# Patient Record
Sex: Male | Born: 1965 | Race: White | Hispanic: No | Marital: Married | Smoking: Never smoker
Health system: Southern US, Community
[De-identification: ages and names within clinical notes are randomized; demographics above are authoritative.]

## PROBLEM LIST (undated history)

## (undated) DIAGNOSIS — R51 Headache: Secondary | ICD-10-CM

## (undated) DIAGNOSIS — E785 Hyperlipidemia, unspecified: Secondary | ICD-10-CM

## (undated) HISTORY — DX: Hyperlipidemia, unspecified: E78.5

## (undated) HISTORY — PX: APPENDECTOMY (OPEN): SHX54

## (undated) HISTORY — DX: Headache: R51

## (undated) HISTORY — PX: WISDOM TOOTH EXTRACTION: SHX21

---

## 2003-09-13 ENCOUNTER — Emergency Department (HOSPITAL_COMMUNITY): Admission: EM | Admit: 2003-09-13 | Discharge: 2003-09-14 | Payer: Self-pay | Admitting: Emergency Medicine

## 2003-09-14 ENCOUNTER — Encounter: Admission: RE | Admit: 2003-09-14 | Discharge: 2003-09-14 | Payer: Self-pay | Admitting: Internal Medicine

## 2007-05-03 ENCOUNTER — Emergency Department (HOSPITAL_COMMUNITY): Admission: EM | Admit: 2007-05-03 | Discharge: 2007-05-03 | Payer: Self-pay | Admitting: Family Medicine

## 2008-04-08 ENCOUNTER — Emergency Department (HOSPITAL_COMMUNITY): Admission: EM | Admit: 2008-04-08 | Discharge: 2008-04-08 | Payer: Self-pay | Admitting: Emergency Medicine

## 2009-12-10 IMAGING — CR DG LUMBAR SPINE COMPLETE 4+V
5 series · 5 of 5 positions shown · non-contrast
Comparison: None peri

CLINICAL DATA: Low back pain following an injury.

LUMBAR SPINE - COMPLETE 4+ VIEW

[t l-spine a.p.]
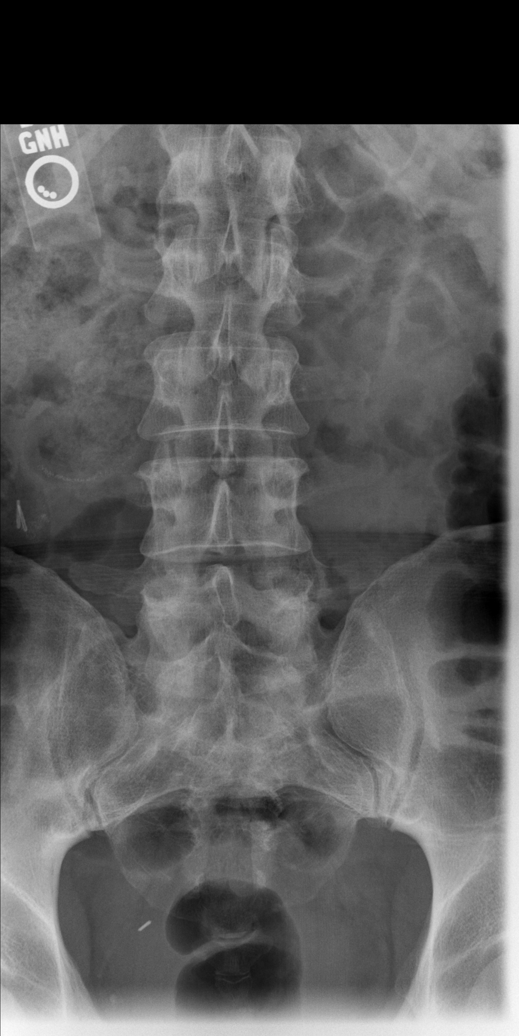

[t l-spine oblique exposure (1 of 2)]
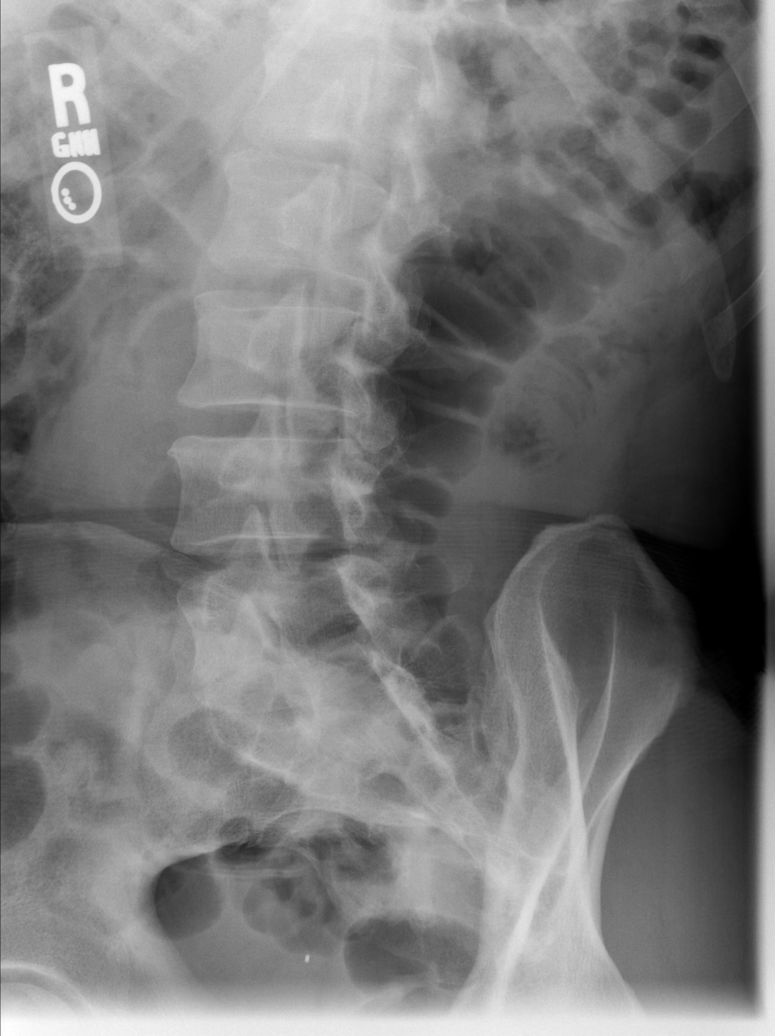

[t l-spine oblique exposure (2 of 2)]
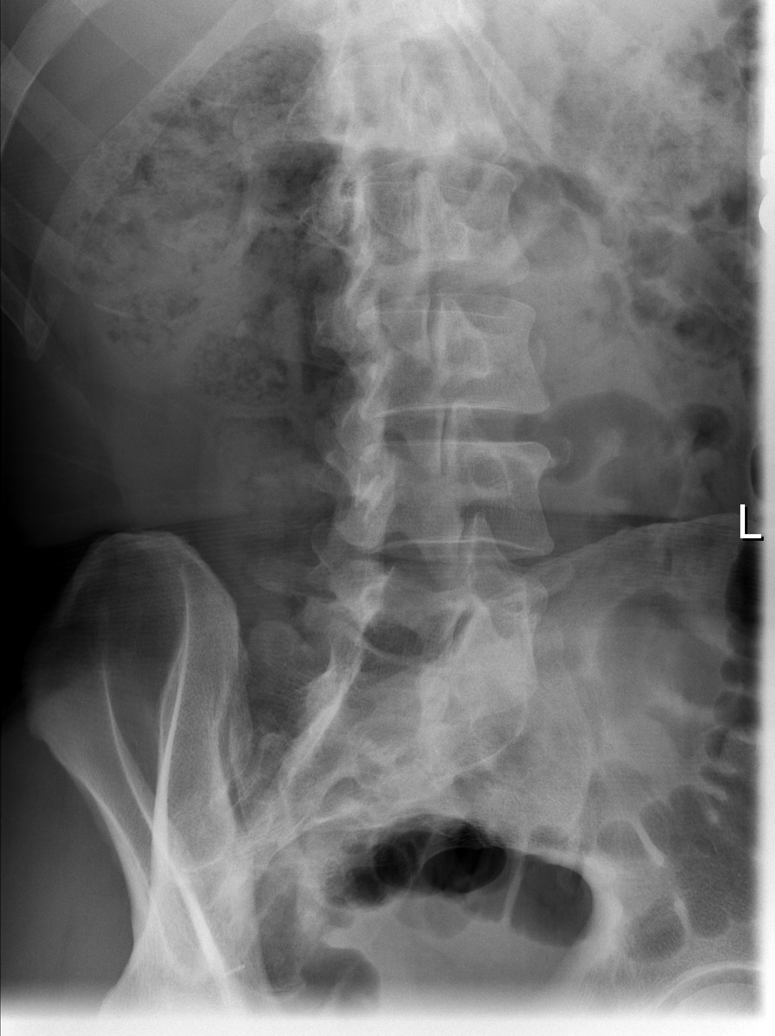

[t l-spine lat]
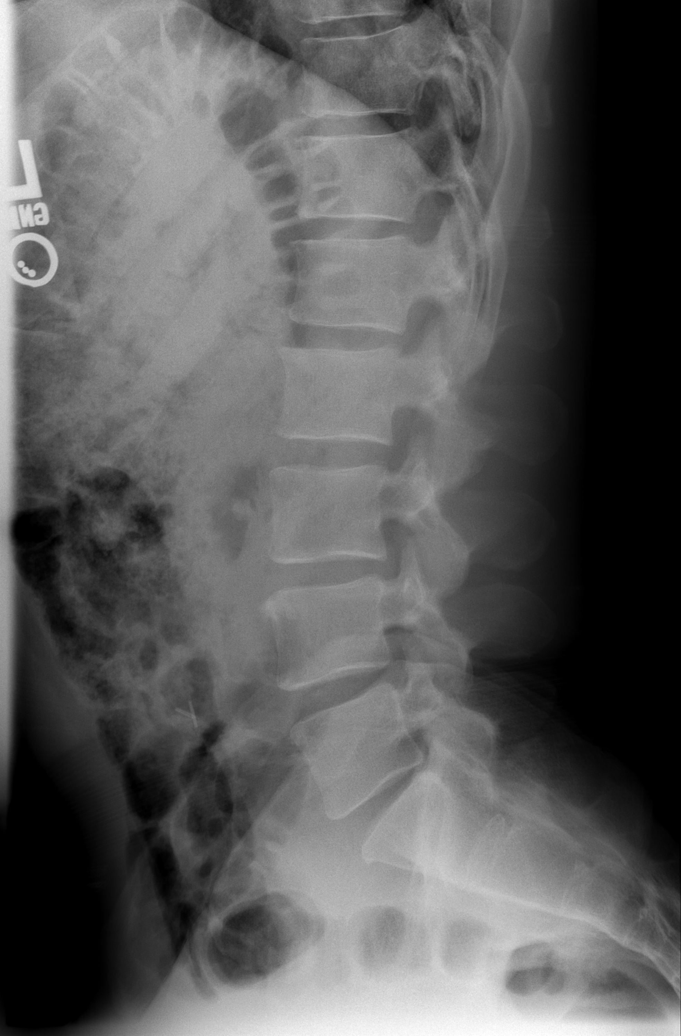

[t l-spine l5-s1 spot]
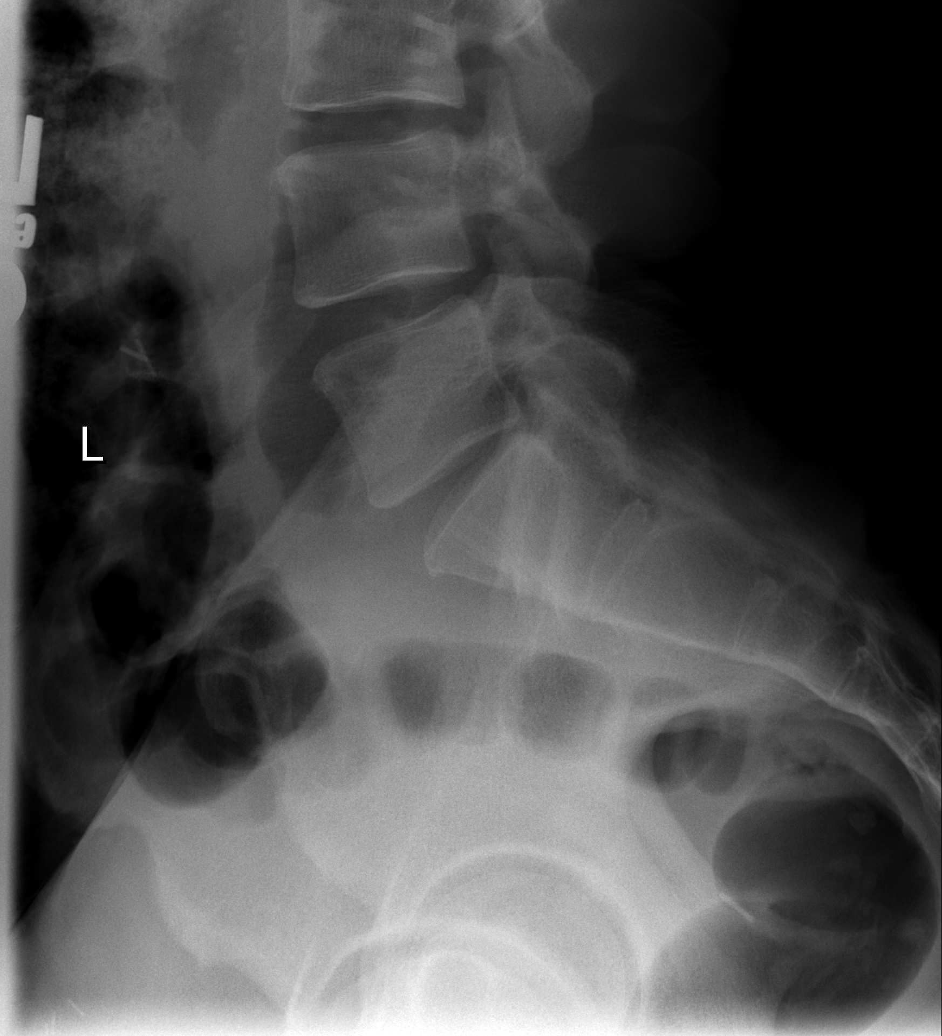

[5 of 5 positions shown; findings below may reference images not displayed]

FINDINGS: Five non-rib bearing lumbar vertebrae.  Mild anterior
spur formation at multiple levels.  No fractures, subluxations or
pars defects seen.  Right lower abdominal surgical clips and
staples.
IMPRESSION: No fracture or subluxation.  Mild degenerative changes.

## 2012-03-23 ENCOUNTER — Ambulatory Visit: Payer: BC Managed Care – PPO | Attending: Foot and Ankle Surgery

## 2012-03-23 NOTE — Pre-Procedure Instructions (Addendum)
No preop testing ordered/required  DOS orders faxed to pharmacy

## 2012-04-05 NOTE — H&P (Signed)
ADMISSION HISTORY AND PHYSICAL EXAM    Date Time: 04/05/2012 9:18 PM  Patient Name: Cody Davenport  Attending Physician: Margarette Asal, MD    Assessment:   Right peroneal tendon tear    Plan:   Right peroneal tendon repair    History of Present Illness:   Cody Davenport is a 46 y.o. male who presents to the hospital with right peroneal tendon tear. Surgery indicated to relieve pain.    Past Medical History:     Past Medical History   Diagnosis Date   . Headache      Occassional migrane       Past Surgical History:     Past Surgical History   Procedure Date   . Appendectomy    . Wisdom tooth extraction        Family History:   No family history on file.    Social History:     History     Social History   . Marital Status: Married     Spouse Name: N/A     Number of Children: N/A   . Years of Education: N/A     Social History Main Topics   . Smoking status: Never Smoker    . Smokeless tobacco: Never Used   . Alcohol Use: 4.2 oz/week     7 Glasses of wine per week   . Drug Use: No   . Sexually Active:      Other Topics Concern   . Not on file     Social History Narrative   . No narrative on file       Allergies:   No Known Allergies    Medications:     No prescriptions prior to admission       Review of Systems:   A comprehensive review of systems was: General ROS: negative    Physical Exam:   There were no vitals filed for this visit.    Intake and Output Summary (Last 24 hours) at Date Time  No intake or output data in the 24 hours ending 04/05/12 2118    General appearance - alert, well appearing, and in no distress  Chest - clear to auscultation, no wheezes, rales or rhonchi, symmetric air entry  Heart - normal rate, regular rhythm, normal S1, S2, no murmurs, rubs, clicks or gallops  Right ankle: TTP peroneal tendons. Neurologically intact.    Labs:     Results     ** No Results found for the last 24 hours. **              Rads:   Radiological Procedure reviewed.    Signed by: Margarette Asal

## 2012-04-06 ENCOUNTER — Ambulatory Visit: Payer: BC Managed Care – PPO | Admitting: Anesthesiology

## 2012-04-06 ENCOUNTER — Encounter
Admission: RE | Disposition: A | Discharge status: Home / Self Care | Payer: Self-pay | Source: Ambulatory Visit | Attending: Foot and Ankle Surgery

## 2012-04-06 ENCOUNTER — Encounter: Payer: Self-pay | Admitting: Anesthesiology

## 2012-04-06 ENCOUNTER — Ambulatory Visit
Admission: RE | Admit: 2012-04-06 | Discharge: 2012-04-06 | Disposition: A | Discharge status: Home / Self Care | Payer: BC Managed Care – PPO | Source: Ambulatory Visit | Attending: Foot and Ankle Surgery | Admitting: Foot and Ankle Surgery

## 2012-04-06 ENCOUNTER — Ambulatory Visit: Payer: BC Managed Care – PPO | Admitting: Foot and Ankle Surgery

## 2012-04-06 DIAGNOSIS — M66879 Spontaneous rupture of other tendons, unspecified ankle and foot: Secondary | ICD-10-CM | POA: Insufficient documentation

## 2012-04-06 DIAGNOSIS — M65979 Unspecified synovitis and tenosynovitis, unspecified ankle and foot: Secondary | ICD-10-CM | POA: Insufficient documentation

## 2012-04-06 DIAGNOSIS — S86319A Strain of muscle(s) and tendon(s) of peroneal muscle group at lower leg level, unspecified leg, initial encounter: Secondary | ICD-10-CM | POA: Diagnosis present

## 2012-04-06 SURGERY — REPAIR, LOWER EXTREMITY TENDON
Anesthesia: Anesthesia General | Site: Leg Lower | Laterality: Right | Wound class: Clean

## 2012-04-06 MED ORDER — MIDAZOLAM HCL 2 MG/2ML IJ SOLN
INTRAMUSCULAR | Status: DC | PRN
Start: 2012-04-06 — End: 2012-04-06
  Administered 2012-04-06 (×2): 1 mg via INTRAVENOUS

## 2012-04-06 MED ORDER — SODIUM CHLORIDE 0.9 % IR SOLN
Status: DC | PRN
Start: 2012-04-06 — End: 2012-04-06
  Administered 2012-04-06: 1000 mL

## 2012-04-06 MED ORDER — DEXAMETHASONE SOD PHOSPHATE PF 10 MG/ML IJ SOLN
INTRAMUSCULAR | Status: AC
Start: 2012-04-06 — End: ?
  Filled 2012-04-06: qty 1

## 2012-04-06 MED ORDER — METOCLOPRAMIDE HCL 5 MG/ML IJ SOLN
INTRAMUSCULAR | Status: AC
Start: 2012-04-06 — End: ?
  Filled 2012-04-06: qty 2

## 2012-04-06 MED ORDER — ROPIVACAINE HCL 2 MG/ML IJ SOLN
8.0000 mL/h | INTRAMUSCULAR | Status: DC
Start: 2012-04-06 — End: 2012-04-06
  Filled 2012-04-06: qty 550

## 2012-04-06 MED ORDER — LACTATED RINGERS IV SOLN
INTRAVENOUS | Status: DC | PRN
Start: 2012-04-06 — End: 2012-04-06

## 2012-04-06 MED ORDER — ROPIVACAINE HCL 5 MG/ML IJ SOLN
INTRAMUSCULAR | Status: AC
Start: 2012-04-06 — End: ?
  Filled 2012-04-06: qty 2

## 2012-04-06 MED ORDER — ONDANSETRON HCL 4 MG/2ML IJ SOLN
INTRAMUSCULAR | Status: DC | PRN
Start: 2012-04-06 — End: 2012-04-06
  Administered 2012-04-06: 4 mg via INTRAVENOUS

## 2012-04-06 MED ORDER — MIDAZOLAM HCL 2 MG/2ML IJ SOLN
2.0000 mg | Freq: Once | INTRAMUSCULAR | Status: AC
Start: 2012-04-06 — End: 2012-04-06
  Administered 2012-04-06: 2 mg via INTRAVENOUS

## 2012-04-06 MED ORDER — FAMOTIDINE 10 MG/ML IV SOLN
INTRAVENOUS | Status: AC
Start: 2012-04-06 — End: ?
  Filled 2012-04-06: qty 2

## 2012-04-06 MED ORDER — METOCLOPRAMIDE HCL 5 MG/ML IJ SOLN
INTRAMUSCULAR | Status: DC | PRN
Start: 2012-04-06 — End: 2012-04-06
  Administered 2012-04-06: 10 mg via INTRAVENOUS

## 2012-04-06 MED ORDER — FAMOTIDINE 10 MG/ML IV SOLN
INTRAVENOUS | Status: DC | PRN
Start: 2012-04-06 — End: 2012-04-06
  Administered 2012-04-06: 20 mg via INTRAVENOUS

## 2012-04-06 MED ORDER — FENTANYL CITRATE 0.05 MG/ML IJ SOLN
INTRAMUSCULAR | Status: AC
Start: 2012-04-06 — End: ?
  Filled 2012-04-06: qty 2

## 2012-04-06 MED ORDER — SODIUM CHLORIDE 0.9 % IV MBP
1.0000 g | Freq: Once | INTRAVENOUS | Status: AC
Start: 2012-04-06 — End: 2012-04-06
  Administered 2012-04-06: 1 g via INTRAVENOUS

## 2012-04-06 MED ORDER — KETOROLAC TROMETHAMINE 30 MG/ML IJ SOLN
INTRAMUSCULAR | Status: DC | PRN
Start: 2012-04-06 — End: 2012-04-06
  Administered 2012-04-06: 30 mg via INTRAVENOUS

## 2012-04-06 MED ORDER — SODIUM CHLORIDE 0.9 % IV SOLN
20.0000 mL/h | INTRAVENOUS | Status: DC | PRN
Start: 2012-04-06 — End: 2012-04-06

## 2012-04-06 MED ORDER — ONDANSETRON HCL 4 MG/2ML IJ SOLN
4.0000 mg | Freq: Once | INTRAMUSCULAR | Status: DC | PRN
Start: 2012-04-06 — End: 2012-04-06

## 2012-04-06 MED ORDER — DEXAMETHASONE SOD PHOSPHATE PF 10 MG/ML IJ SOLN
INTRAMUSCULAR | Status: DC | PRN
Start: 2012-04-06 — End: 2012-04-06
  Administered 2012-04-06: 20 mL via PERINEURAL

## 2012-04-06 MED ORDER — OXYCODONE-ACETAMINOPHEN 5-325 MG PO TABS
1.0000 | ORAL_TABLET | Freq: Once | ORAL | Status: DC | PRN
Start: 2012-04-06 — End: 2012-04-06

## 2012-04-06 MED ORDER — PROPOFOL 10 MG/ML IV EMUL
INTRAVENOUS | Status: AC
Start: 2012-04-06 — End: ?
  Filled 2012-04-06: qty 1

## 2012-04-06 MED ORDER — MIDAZOLAM HCL 2 MG/2ML IJ SOLN
INTRAMUSCULAR | Status: AC
Start: 2012-04-06 — End: ?
  Filled 2012-04-06: qty 1

## 2012-04-06 MED ORDER — KETOROLAC TROMETHAMINE 30 MG/ML IJ SOLN
INTRAMUSCULAR | Status: AC
Start: 2012-04-06 — End: ?
  Filled 2012-04-06: qty 1

## 2012-04-06 MED ORDER — ROPIVACAINE 0.2 % ON-Q 550 ML ADJ RATE SINGLE FLOW (OUTSOURCED)
Status: DC
Start: 2012-04-06 — End: 2012-04-06
  Filled 2012-04-06: qty 1

## 2012-04-06 MED ORDER — CEFAZOLIN SODIUM 1 G IJ SOLR
INTRAMUSCULAR | Status: DC | PRN
Start: 2012-04-06 — End: 2012-04-06

## 2012-04-06 MED ORDER — BUPIVACAINE HCL (PF) 0.5 % IJ SOLN
INTRAMUSCULAR | Status: AC
Start: 2012-04-06 — End: ?
  Filled 2012-04-06: qty 1

## 2012-04-06 MED ORDER — DEXAMETHASONE SODIUM PHOSPHATE 4 MG/ML IJ SOLN
INTRAMUSCULAR | Status: DC | PRN
Start: 2012-04-06 — End: 2012-04-06
  Administered 2012-04-06: 4 mg via INTRAVENOUS

## 2012-04-06 MED ORDER — FENTANYL CITRATE 0.05 MG/ML IJ SOLN
50.0000 ug | INTRAMUSCULAR | Status: DC | PRN
Start: 2012-04-06 — End: 2012-04-06

## 2012-04-06 MED ORDER — OXYCODONE-ACETAMINOPHEN 5-325 MG PO TABS
2.0000 | ORAL_TABLET | ORAL | Status: AC | PRN
Start: 2012-04-06 — End: 2012-04-16

## 2012-04-06 MED ORDER — FENTANYL CITRATE 0.05 MG/ML IJ SOLN
INTRAMUSCULAR | Status: DC | PRN
Start: 2012-04-06 — End: 2012-04-06
  Administered 2012-04-06 (×2): 50 ug via INTRAVENOUS

## 2012-04-06 MED ORDER — ONDANSETRON HCL 4 MG/2ML IJ SOLN
INTRAMUSCULAR | Status: AC
Start: 2012-04-06 — End: ?
  Filled 2012-04-06: qty 2

## 2012-04-06 MED ORDER — PROPOFOL INFUSION 10 MG/ML
INTRAVENOUS | Status: DC | PRN
Start: 2012-04-06 — End: 2012-04-06
  Administered 2012-04-06: 250 mg via INTRAVENOUS

## 2012-04-06 MED ORDER — LIDOCAINE HCL 2 % IJ SOLN
INTRAMUSCULAR | Status: DC | PRN
Start: 2012-04-06 — End: 2012-04-06
  Administered 2012-04-06: 50 mg

## 2012-04-06 MED ORDER — CEFAZOLIN SODIUM 1 G IJ SOLR
INTRAMUSCULAR | Status: AC
Start: 2012-04-06 — End: ?
  Filled 2012-04-06: qty 1000

## 2012-04-06 MED ORDER — LIDOCAINE HCL 2 % IJ SOLN
INTRAMUSCULAR | Status: AC
Start: 2012-04-06 — End: ?
  Filled 2012-04-06: qty 1

## 2012-04-06 MED ORDER — DEXAMETHASONE SODIUM PHOSPHATE 4 MG/ML IJ SOLN
INTRAMUSCULAR | Status: AC
Start: 2012-04-06 — End: ?
  Filled 2012-04-06: qty 1

## 2012-04-06 MED ORDER — ROPIVACAINE HCL 5 MG/ML IJ SOLN
INTRAMUSCULAR | Status: DC | PRN
Start: 2012-04-06 — End: 2012-04-06
  Administered 2012-04-06 (×2): 20 mL via PERINEURAL

## 2012-04-06 SURGICAL SUPPLY — 41 items
BNDG ACE ELASTIC 4IN STRL (Procedure Accessories) ×2 IMPLANT
BNDG ACE ELASTIC 6IN STRL (Procedure Accessories) ×2 IMPLANT
CLOTH BEACON TIMEOUT ORANGE (Other) ×2 IMPLANT
DRAIN PENROSE 12X.25 STERILE (Drain) ×3 IMPLANT
DRESSING PETRO 3% BI 3BRM GZE XR 8X1IN (Dressing) ×1
DRESSING PETROLATUM XEROFORM L8 IN X W1 (Dressing) ×1
DRESSING PETROLATUM XEROFORM L8 IN X W1 IN 3% BISMUTH TRIBROMOPHENATE (Dressing) IMPLANT
GLOVE SURG BIOGEL INDIC SZ 8.5 (Glove) ×2 IMPLANT
GLOVE SURG BIOGEL LF SZ8 (Glove) ×4 IMPLANT
GOWN SMART IMPERVIOUS LARGE (Gown) ×4 IMPLANT
INACTIVE USE LAWSON 93583 (Gown) ×2 IMPLANT
KIT INFECTION CONTROL CUSTOM (Kits) ×2
KIT INFECTION CONTROL CUSTOM IFOH03 (Kits) ×1 IMPLANT
LOOP VESSEL MAXI ELLIPTICAL L406 MM X W1 (Procedure Accessories) ×1
LOOP VESSEL MAXI ELLIPTICAL L406 MM X W1 MM RED 2 POUCH RADIOPAQUE (Procedure Accessories) ×2 IMPLANT
LOOP VSL SIL MAXI ELIP STERION 406X1MM (Procedure Accessories) ×1
NEEDLE INJ SFTY 22GX1.5IN (Needles) ×2 IMPLANT
NEEDLE SUT SS CATGUT .5 CRC RCHRD-ALLAN (Suture) ×1
NEEDLE SUTURE 1/2 CIRCLE L1.404 IN (Suture) ×1
NEEDLE SUTURE 1/2 CIRCLE L1.404 IN TROCAR POINT FREE EYE RICHARD-ALLAN (Suture) ×1 IMPLANT
PADDING CAST L4 YD X W4 IN UNDERCAST (Cast) ×1
PADDING CAST L4 YD X W4 IN UNDERCAST MILD STRETCH COHESIVE REGULAR (Cast) ×2 IMPLANT
PADDING CST CTTN WBRL 4YDX4IN LF STRL (Cast) ×1
SOL BETADINE SOLUTION 4 OZ (Prep) ×2 IMPLANT
SPONGE GAUZE L4 IN X W4 IN 12 PLY (Sponge) IMPLANT
SPONGE GZE PLS CTTN CRTY 4X4IN LF STRL (Sponge) ×1
STAPLER SKIN L3.9 MM X W6.9 MM WIDE 35 (Skin Closure) ×1
STAPLER SKIN L3.9 MM X W6.9 MM WIDE 35 COUNT FIX HEAD RATCHET (Skin Closure) IMPLANT
STAPLER SKN SS W PRX PX .58MM 3.9X6.9MM (Skin Closure) ×1
SUTURE ABS 2-0 CT1 VCL 18IN CR BRD 8 (Suture) ×1
SUTURE COATED VICRYL 2-0 CT-1 18IN CNTRL BRD 8 STRND UNDYED (Suture) ×1 IMPLANT
SUTURE COATED VICRYL 2-0 CT-1 L18 IN (Suture) ×1
SUTURE ETHILON 4-0 FS2 18 (Suture) ×2 IMPLANT
SUTURE FIBERWIRE BLUE 2-0 T-13 L18 IN MULTISTRAND LOWER KNOT PROFILE (Suture) IMPLANT
SUTURE NABSB 2-0 T-13 FBRWR 18IN MS LWR (Suture) ×4
SUTURE VICRYL 3-0 SH 8X18IN (Suture) ×2 IMPLANT
TAPE SCOTCHCAST+ 4INX4YD (Cast) ×2 IMPLANT
TOURNIQUET 30'STRL EA=1 (Ortho Supply) ×1 IMPLANT
TOWEL L26 IN X W17 IN COTTON PREWASH DELINT BLUE ACTISORB DELUXE (Procedure Accessories) ×1 IMPLANT
TOWEL SRG CTTN 26X17IN LF STRL PREWASH (Procedure Accessories) ×2
TRAY SKIN DRY SCRUB (Tray) ×1 IMPLANT

## 2012-04-06 NOTE — Anesthesia Postprocedure Evaluation (Signed)
Anesthesia Post Evaluation    Patient: Cody Davenport    Procedures performed: Procedure(s) with comments:  REPAIR, LOWER EXTREMITY TENDON - RIGHT PERONEAL TENDON REPAIRS      Anesthesia type: General LMA    Patient location:Phase I PACU    Last vitals:   Filed Vitals:    04/06/12 1444   BP: 148/88   Pulse: 67   Temp:    Resp: 16   SpO2: 98%       Post pain: Patient not complaining of pain, continue current therapy     Mental Status:awake    Respiratory Function: tolerating nasal cannula    Cardiovascular: stable    Nausea/Vomiting: patient not complaining of nausea or vomiting    Hydration Status: adequate    Post assessment: no apparent anesthetic complications

## 2012-04-06 NOTE — Interval H&P Note (Signed)
NO INTERVAL CHANGE SINCE LAST H&P

## 2012-04-06 NOTE — Anesthesia Procedure Notes (Addendum)
Peripheral    Patient location during procedure: pre-op  Start time: 04/06/2012 10:54 AM  End time: 04/06/2012 11:18 AM  Block Region: lower extremity  Block type: popliteal  Laterality: right  Injection technique: catheterBlock at surgeon's request YesReason for block: post-op pain management  Staffing  Anesthesiologist: Stana Bayon B  Performed by: Anesthesiologist Preanesthetic Checklist Completed: patient identified, surgical consent, pre-op evaluation, timeout performed, risks and benefits discussed, anesthesia consent given and correct site  Timeout Completed:  04/06/2012 10:54 AM  Peripheral Block  Patient monitoring: pulse oximetry, EKG, NIBP and nasal cannula O2  Patient position: supineSterile Technique: ChloraPrep, sterile drape and sterile glovesPremedication: yes  Procedures: ultrasound guided  Local infiltration: lidocaine 1%  Needle  Needle type: Tuohy     Needle gauge: 17 G    Needle length: 4 in      Catheter size: 19 G    Catheter at skin depth: 10 cm      Ultrasound Guided: LA spread visualized, Image stored or printed, Needle visualized, Catheter visualized and Relevant anatomy identified (nerve, vessels, muscle)    AssessmentIncremental injection: yes    Injection made incrementally with aspirations every 5 mL.    Injection Resistance: 5 cm  Injection Resistance: no  Paresthesia Pain: no    Blood Aspirated: No    no suspected intravascular injection  Block Outcome: patient tolerated procedure well, no complications and successful block    Peripheral    Patient location during procedure: pre-op  Start time: 04/06/2012 11:19 AM  End time: 04/06/2012 11:22 AM  Block Region: lower extremity  Block type: saphenous  Laterality: right  Injection technique: single-shotBlock at surgeon's request YesReason for block: post-op pain management  Staffing  Anesthesiologist: Suetta Hoffmeister B  Performed by: Anesthesiologist Preanesthetic Checklist Completed: patient identified, surgical consent, pre-op evaluation,  timeout performed, risks and benefits discussed, anesthesia consent given and correct site  Timeout Completed:  04/06/2012 10:54 AM  Peripheral Block  Patient monitoring: pulse oximetry, EKG, NIBP and nasal cannula O2  Patient position: supineSterile Technique: ChloraPrep and sterile glovesPremedication: yes  Procedures: ultrasound guided  Local infiltration: lidocaine 1%  Needle  Needle type: Stimuplex     Needle gauge: 21 G    Needle length: 4 in            Ultrasound Guided: LA spread visualized, Image stored or printed, Needle visualized and Relevant anatomy identified (nerve, vessels, muscle)    AssessmentIncremental injection: yes    Injection made incrementally with aspirations every 5 mL.    Injection Resistance: 5 cm  Injection Resistance: no  Paresthesia Pain: no    Blood Aspirated: No    no suspected intravascular injection  Block Outcome: patient tolerated procedure well, no complications and successful block

## 2012-04-06 NOTE — Anesthesia Preprocedure Evaluation (Signed)
Anesthesia Evaluation    AIRWAY    Mallampati: II    TM distance: >3 FB  Neck ROM: full  Mouth Opening:full   CARDIOVASCULAR    cardiovascular exam normal, regular and bradycardic     DENTAL    No notable dental hx     PULMONARY    pulmonary exam normal and clear to auscultation     OTHER FINDINGS                  Anesthesia Plan    ASA 1   general         Post op pain management: PNB catheter          informed consent obtained

## 2012-04-06 NOTE — Transfer of Care (Signed)
Report given to nurse. Care transferred.

## 2012-04-06 NOTE — Progress Notes (Signed)
Block procedure completed with sedation. Time out performed with anesthesiologist. Patient tolerated the procedure with no adverse side effects. Procedure discussed prior to start. Peripheral nerve block education initiated, including fall precautions/sensory deficits and pain management.  Patient and responsible adult verbalized understanding.

## 2012-04-06 NOTE — Discharge Instructions (Signed)
This information is to help you in your recovery after foot surgery. Please read this information carefully. Feel free to ask your doctor or nurse any questions about your recovery at home. You will receive further instructions at your postop appointment. Please take your xrays/ MRI's/ CT scans and other xray studies home with you.    1.      REST, ICE AND ELEVATION  Remain generally at rest. Apply ice over the dressing and elevate your foot "Toes above the nose" for the first 48 hours to help with swelling. If you are using a Polar Care Ice Cooler, use for 30 minutes on and then 30 minutes off during waking hours. Dressings must be kept dry.   Wiggle toes frequently if possible.              2.     CRUTCHES  Use crutches or walker and put NO WEIGHT on the operative foot.      3.     MEDICATION  Take the following medication as directed:  __x___ Aspirin 325mg twice daily for DVT prophylaxis  _____ Tylenol #3: 1-2 every 4 hours as needed for pain  __x___ Percocet: 1-2 every 6 hours as needed for pain  _____ Phenergan: 1 tablet every 4-6 hours for nausea or vomiting  _____ Colace 100mg twice daily while on narcotics  To minimize gastric upset from narcotics or anti-inflammatory medicine, eat adequate amounts of food.    Increase fluid and fiber intake to ease constipation from pain medicine.    4.     DRESSING/SHOWERING  Leave dressing intact until seen in the office postop. Slight bleeding through the dressing/cast is common. Reinforce the dressing/cast with 4x4 gauze pads and gauze wrap as needed.   No baths, no hot tubs, no swimming.  Cover your dressing/cast with plastic when showering.   Use rubbing alcohol to remove the betadine prep solution from the skin.      5.     QUESTIONS/CONCERNS  Your surgeon should be contacted by calling [phone], 24 hours a day for any of the                     following symptoms:  Fever greater than 101 degrees F for more than 2 days.    Numbness, loss of good  color or coolness in the foot.    Severe pain unresponsive to narcotic medication.  Excessive bleeding or vomiting.  Difficulty breathing or shortness of breath, CALL 911.     6.  FOLLOW-UP  Schedule your post-operative follow up appointment in 10-14 days                                                       Kevin C. Lutta, M.D.      You have received Toradol 30mg at____ . Do not take Ibuprofen/Advil/Motrin until ______.  You have received ______________ at _______. Do not take any additional pain medication until___________.    Anesthesia: After Your Surgery  You've just had surgery. During surgery, you received medication called anesthesia to keep you comfortable and pain-free. After surgery, you may experience some pain or nausea. This is normal. Here are some tips for feeling better and recovering after surgery.     Stay on schedule with your medication.   Going Home    Your doctor or nurse will show you how to take care of yourself when you go home. He or she will also answer your questions. Have an adult family member or friend drive you home. For the first 24 hours after your surgery:   Do not drive or use heavy equipment.   Do not make important decisions or sign documents.   Avoid alcohol.   Have someone stay with you, if possible. They can watch for problems and help keep you safe.  Be sure to keep all follow-up doctor's appointments. And rest after your procedure for as long as your doctor tells you to.    Coping with Pain  If you have pain after surgery, pain medication will help you feel better. Take it as directed, before pain becomes severe. Also, ask your doctor or pharmacist about other ways to control pain, such as with heat, ice, and relaxation. And follow any other instructions your surgeon or nurse gives you.    Tips for Taking Pain Medication  To get the best relief possible, remember these points:   Pain medications can upset your stomach. Taking them with a little food may help.   Most  pain relievers taken by mouth need at least 20 to 30 minutes to take effect.   Taking medication on a schedule can help you remember to take it. Try to time your medication so that you can take it before beginning an activity, such as dressing, walking, or sitting down for dinner.   Constipation is a common side effect of pain medications. Drink lots of fluids. Eating fruit and vegetables can also help. Don't take laxatives unless your surgeon has prescribed them.   Mixing alcohol and pain medication can cause dizziness and slow your breathing. It can even be fatal. Don't drink alcohol while taking pain medication.   Pain medication can slow your reflexes. Don't drive or operate machinery while taking pain medication,    Managing Nausea  Some people have an upset stomach after surgery. This is often due to anesthesia, pain, pain medications, or the stress of surgery. The following tips will help you manage nausea and get good nutrition as you recover. If you were on a special diet before surgery, ask your doctor if you should follow it during recovery. These tips may help:   Don't push yourself to eat. Your body will tell you what to eat and when.   Start off with liquids and soup. They are easier to digest.   Progress to semisolids (mashed potatoes, applesauce, and gelatin) as you feel ready.   Slowly move to solid foods. Don't eat fatty, rich, or spicy foods at first.   Don't force yourself to have three large meals a day. Instead, eat smaller amounts more often.   Take pain medications with a small amount of solid food, such as crackers or toast.    Important:   Prevent blood clots by wiggling your toes and tightening your calf muscle 20 times every hour while awake until you resume normal walking activity.   If unable to urinate in 6-8 hours, call your surgeon or go to the Emergency Department.     Call Your Surgeon If.   You still have pain an hour after taking medication (it may not be strong  enough).   You feel too sleepy, dizzy, or groggy (medication may be too strong).   You have side effects like nausea, vomiting, or skin changes (rash, itching, or hives).   Signs of   infection - fever over 101, chills. Incision site redness, warmth, swelling, foul odor or purulent drainage.   Persistent bleeding at the operative site.    2000-2011 [employer], [address], [place] [address]. All rights reserved. This information is not intended as a substitute for professional medical care. Always follow your healthcare professional's instructions.

## 2012-04-07 NOTE — Progress Notes (Signed)
Post-op Day 1    Date:  04/07/2012 Time:  11:32 AM      Visit Type:  Outpatient Phone Call:   Spoke with patient    Subjective:  No complaints    Objective:  Pain Score  0                    Motor Block  No                    Sensory Block  Yes                    Catheter site  Clean & dry    Assessment:  Catheter Functioning as expected    Plan:  Continuous Local Anesthetic (LA) Infusion    Would pt receive block again?  Yes

## 2012-04-07 NOTE — Addendum Note (Signed)
Addendum  created 04/07/12 1133 by Darrell Jewel, MD    Modules edited:Inpatient Notes

## 2012-04-08 NOTE — Op Note (Signed)
Procedure Date: 04/06/2012     Patient Type: A     SURGEON: Margarette Asal MD  ASSISTANT:  Cathey Endow     PREOPERATIVE DIAGNOSIS:  Right peroneal tendon rupture.     POSTOPERATIVE DIAGNOSIS:   Right peroneal tendon rupture.     TITLE OF PROCEDURE:   Right peroneus longus tenodesis.     INDICATION:   Cody Davenport is a 46 year old gentleman who sustained a rupture of the  peroneus longus tendon several weeks ago.  Surgery was indicated to relieve  pain.  Benefits and potential risks of surgery were discussed in detail  prior to obtaining informed consent for surgery.     ANESTHESIA:  General.     ESTIMATED BLOOD LOSS:  Minimal.     COMPLICATIONS:  None.     DESCRIPTION OF PROCEDURE:  The patient was brought to the operating room and identified by the  surgeon.  The operative site was marked.  Preoperative antibiotics were  given.  After induction of general anesthesia, the right lower extremity  was prepped and draped in the usual sterile fashion.  Thigh tourniquet was  inflated.  A curvilinear incision was made at the lateral ankle overlying  the peroneal tendon.  The peroneal tendon sheath was identified and opened.   Inspection of the peroneal tendon showed extensive tendinosis of the  peroneus longus tendon and rupture most of the length of the tendon with os  peroneum distally adjacent to the site of a complete rupture of the  peroneus longus tendon.  Inspection of the peroneus brevis tendon showed  scar tissue but no tear.  The peroneus longus tendon was resected  proximally.  The residual stump of the peroneus longus tendon was then  weaved through the peroneal brevis tendon using the Pulvertaft technique.   FiberWire 2-0 sutures were used to secure the peroneus longus to the  peroneus brevis.  The wound was then irrigated.  The peroneal retinaculum  was repaired with 2-0 Vicryl suture.  The sheath was closed with a running  2-0 Vicryl suture.  Skin was closed with 3-0 Vicryl suture and skin  staples.   Compression dressing and splint were applied.  The tourniquet was  removed.  The patient was transferred to recovery and arrived in  satisfactory condition.           D:  04/07/2012 23:21 PM by Dr. [place]aryn [place] ([id number])  T:  04/08/2012 07:51 AM by Netta Cedars      Everlean Cherry: [id number]) (Doc ID: [id number])

## 2012-04-08 NOTE — Addendum Note (Signed)
Addendum  created 04/08/12 1021 by Darrell Jewel, MD    Modules edited:Inpatient Notes

## 2012-04-08 NOTE — Progress Notes (Signed)
Post-op Day 2    Date:  04/08/2012 Time:  10:20 AM      Visit Type:  Outpatient Phone Call:   Left message

## 2012-04-24 NOTE — Op Note (Signed)
Procedure Date: 04/06/2012     Patient Type: A     SURGEON: Margarette Asal MD  ASSISTANT:       ADDENDUM  PREOPERATIVE DIAGNOSES:  Right peroneus longus tendon rupture, peroneus brevis tendinosis.     POSTOPERATIVE DIAGNOSES:  Right peroneus longus tendon rupture, peroneus brevis tendinosis.     TITLE OF PROCEDURE:  Addendum:  Right peroneus longus tenodesis, peroneus brevis tenolysis.     DESCRIPTION OF PROCEDURE:    Addendum to description of procedure:  There was scar tissue surrounding  the peroneus brevis tendon.  Metzenbaum scissors were used to release all  the scar tissue from the peroneus brevis tendon.  We then proceeded with  peroneus longus tenodesis.           D:  04/23/2012 18:24 PM by Dr. Caryn Bee C. Jim Desanctis[place] MD ([id number])  T:  04/24/2012 13:07 PM by       Everlean Cherry: [id number]) (Doc ID: [id number])

## 2012-05-03 NOTE — Brief Op Note (Signed)
BRIEF OP NOTE    Date Time: 05/03/2012 8:57 PM    Patient Name:   Cody Davenport    Date of Operation:   04/06/2012    Providers Performing:   Surgeon(s):  Margarette Asal, MD    Assistant (s):   Lattie Haw, RN - Circulator  Cammy Copa R - Scrub Person  Pieri, Frederick - First Assistant  Zevgolis, Erma Heritage, RN - Relief Circulator    Operative Procedure:   Procedure(s):  REPAIR, LOWER EXTREMITY TENDON    Preoperative Diagnosis:   Pre-Op Diagnosis Codes:     * Nontraumatic rupture of other tendons of foot and ankle [727.68]    Postoperative Diagnosis:   Nontraumatic rupture of other tendons of foot and ankle    Anesthesia:   General    Estimated Blood Loss:   minimal    Implants:   * No implants in log *    Drains:   Drains: no    Specimens:     none  Findings:   Complete rupture peroneus longus tendon    Complications:   none      Signed by: Margarette Asal, MD                                                                           [facility]

## 2016-09-05 ENCOUNTER — Ambulatory Visit (INDEPENDENT_AMBULATORY_CARE_PROVIDER_SITE_OTHER): Payer: Self-pay | Admitting: Cardiovascular Disease

## 2016-09-08 ENCOUNTER — Other Ambulatory Visit: Payer: Self-pay | Admitting: Cardiovascular Disease

## 2016-09-08 DIAGNOSIS — R9431 Abnormal electrocardiogram [ECG] [EKG]: Secondary | ICD-10-CM

## 2016-09-15 ENCOUNTER — Ambulatory Visit
Admission: RE | Admit: 2016-09-15 | Discharge: 2016-09-15 | Disposition: A | Discharge status: Home / Self Care | Payer: BC Managed Care – PPO | Source: Ambulatory Visit | Attending: Cardiovascular Disease | Admitting: Cardiovascular Disease

## 2016-09-15 DIAGNOSIS — R9431 Abnormal electrocardiogram [ECG] [EKG]: Secondary | ICD-10-CM | POA: Insufficient documentation

## 2016-09-15 DIAGNOSIS — I77810 Thoracic aortic ectasia: Secondary | ICD-10-CM | POA: Insufficient documentation

## 2020-07-20 ENCOUNTER — Other Ambulatory Visit: Payer: Self-pay

## 2020-07-20 ENCOUNTER — Ambulatory Visit
Admission: RE | Admit: 2020-07-20 | Discharge: 2020-07-20 | Disposition: A | Discharge status: Home / Self Care | Payer: Self-pay | Source: Ambulatory Visit | Attending: Internal Medicine | Admitting: Internal Medicine

## 2020-07-20 ENCOUNTER — Other Ambulatory Visit: Payer: Self-pay | Admitting: Internal Medicine

## 2020-07-20 DIAGNOSIS — R059 Cough, unspecified: Secondary | ICD-10-CM

## 2020-08-28 ENCOUNTER — Ambulatory Visit (INDEPENDENT_AMBULATORY_CARE_PROVIDER_SITE_OTHER): Payer: No Typology Code available for payment source | Admitting: Pulmonary Disease

## 2020-08-28 ENCOUNTER — Other Ambulatory Visit: Payer: Self-pay

## 2020-08-28 ENCOUNTER — Encounter: Payer: Self-pay | Admitting: Pulmonary Disease

## 2020-08-28 VITALS — BP 126/84 | HR 60 | Temp 97.4°F | Ht 76.0 in | Wt 230.0 lb

## 2020-08-28 DIAGNOSIS — R053 Chronic cough: Secondary | ICD-10-CM

## 2020-08-28 MED ORDER — BREO ELLIPTA 200-25 MCG/INH IN AEPB: 1.0000 | INHALATION_SPRAY | Freq: Every day | RESPIRATORY_TRACT | 0 refills | Status: DC

## 2020-08-28 NOTE — Patient Instructions (Signed)
Nice to meet you  Based on the changes of your x-ray as well as your lack of response to other treatments, suspect something like asthma or inflammation in the ear tubes could be contributing to your cough.  Use Breo 1 puff daily-rinse your mouth out after every use with water.  This is used to treat inflammation in the lungs.  I am hopeful this will help improve your cough.  If things are not getting better after about 4 weeks, it is okay to stop the medicine.  If things are better in the next 2 to 4 weeks, please call me and I will send in a prescription for the [facility].  I will see back in 3 months for follow-up with Dr. Silas Flood, sooner as needed

## 2020-08-29 NOTE — Progress Notes (Signed)
@Patient  ID: Craig Burnett, male    DOB: [birth year], 55 y.o.   MRN: [id number]  Chief Complaint  Patient presents with  . Consult    Referred by PCP for chronic cough for the past 4 months. States he tends to cough more with talking and not with exercise. Described as a dry cough. Denies any chest pain.     Referring provider: Wenda Low, MD  HPI:   55 year old man whom are seen in consultation for evaluation of chronic cough.  Most recent PCP note reviewed.  Patient has had cough for about 4 months now.  Essentially dry.  Rarely produces sputum.  No clear timing in the day when is better or worse.  Does not keep him up at night.  No environmental changes-no recent move, renovation, flooding, etc.  Does not seem worse in the last couple weeks as the temperatures risen and more pollen and flowering trees have been out.  No alleviating or exacerbating factors.  He is tried PPI for about a month now without improvement.  He denies any postnasal drip, nasal congestion.  He denies any history of seasonal allergies, no history of asthma.  Reviewed chest x-ray 07/20/2020 which on my interpretation reveals increased bronchial markings particularly right hilar area as well as hyperinflation with increased lucency between anterior chest and heart on lateral film.  This was compared to prior most recent chest x-ray in 2005 which showed similar findings on my interpretation.  PMH: Reviewed, denies significant medical history Surgical history:History reviewed. No pertinent surgical history. Family history: History reviewed. No pertinent family history.  Denies any respiratory issues in first-degree relatives. 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  MMRC: mMRC Dyspnea Scale mMRC Score  08/28/2020 0    Epworth:  No  flowsheet data found.  Tests:   FENO:  No results found for: NITRICOXIDE  PFT: No flowsheet data found.  WALK:  No flowsheet data found.  Imaging: Personally reviewed and as per EMR and discussion of this note  Lab Results:  CBC No results found for: WBC, RBC, HGB, HCT, PLT, MCV, MCH, MCHC, RDW, LYMPHSABS, MONOABS, EOSABS, BASOSABS  BMET No results found for: NA, K, CL, CO2, GLUCOSE, BUN, CREATININE, CALCIUM, GFRNONAA, GFRAA  BNP No results found for: BNP  ProBNP No results found for: PROBNP  Specialty Problems   None     No Known Allergies  Immunization History  Administered Date(s) Administered  . Influenza,inj,Quad PF,6-35 Mos 04/03/2020  . PFIZER(Purple Top)SARS-COV-2 Vaccination 02/23/2020, 03/15/2020  . Tdap 06/26/2019  . Zoster 08/10/2019    History reviewed. No pertinent past medical history.  Tobacco History: Social History   Tobacco Use  Smoking Status Never Smoker  Smokeless Tobacco Never Used   Counseling given: Not Answered   Continue to not smoke  Outpatient Encounter Medications as of 08/28/2020  Medication Sig  . fluticasone furoate-vilanterol (BREO ELLIPTA) 200-25 MCG/INH AEPB Inhale 1 puff into the lungs daily.  Marland Kitchen omeprazole (PRILOSEC) 40 MG capsule Take 40 mg by mouth every morning.   No facility-administered encounter medications on file as of 08/28/2020.     Review of Systems  Review of Systems  No chest pain.  No orthopnea or PND.  No dyspnea on exertion.  Comprehensive review of systems otherwise negative.  Physical Exam  BP 126/84  Pulse 60   Temp (!) 97.4 F (36.3 C) (Temporal)   Ht 6\' 4"  (1.93 m)   Wt 230 lb (104.3 kg)   SpO2 97% Comment: on RA  BMI 28.00 kg/m   Wt Readings from Last 5 Encounters:  08/28/20 230 lb (104.3 kg)    BMI Readings from Last 5 Encounters:  08/28/20 28.00 kg/m     Physical Exam General: Well-appearing, no acute distress Eyes: EOMI, no icterus Neck: Supple, no JVD  appreciated Pulmonary: Clear oxygen bilaterally, high-pitched sound left upper lung field hard to distinguish whether end inspiration or early expiration Cardiovascular regular rhythm, no murmur Abdomen: Nondistended, Bowel sounds present MSK: No synovitis, joint effusion Neuro: No weakness, normal gait Psych: No mood, full affect   Assessment & Plan:   Chronic cough: New issue.  No clear trigger.  His chest x-ray recently in the past have shown evidence of hyperinflation as well as increased bronchial markings particular on the right.  This is suspicious for asthma.  No postnasal drip or nasal congestion.  Has not responded to PPI therapy.  Start Breo high-dose 1 puff daily to see if helps improve cough given suspicion for asthma.   Return in about 3 months (around 11/28/2020).   Lanier Clam, MD 08/29/2020

## 2020-09-04 ENCOUNTER — Ambulatory Visit: Payer: Self-pay | Admitting: Surgery

## 2020-09-04 NOTE — H&P (Signed)
Craig Burnett  DOB: December 25, 1965 Married / Language: English / Race: White Male  History of Present Illness  Patient words: 55 year old man with history of chronic bronchitis (recently diagnosed, improved with breo-ellipta) but no significant medical history presents for evaluation of a left inguinal hernia. Has been present for quite some time that has been increasingly bothersome with discomfort recently. 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 He did have a right inguinal hernia repair as a child. Denies any gastrointestinal or bladder symptoms.  Past Surgical History  Foot Surgery Bilateral. Open Inguinal Hernia Surgery Right. Vasectomy  Diagnostic Studies History  Colonoscopy 1-5 years ago  Allergies  No Known Drug Allergies   Medication History Fluticasone Furoate (27.5MCG/SPRAY Suspension, Nasal) Active. Fluticasone-Salmeterol (113-14MCG/ACT Aero Pow Br Act, Inhalation) Active. Medications Reconciled  Social History Alcohol use Occasional alcohol use. Caffeine use Coffee. No drug use Tobacco use Never smoker.  Family History Arthritis Father. Depression Brother, Father. Heart Disease Father. Heart disease in male family member before age 30 Hypertension Brother. Melanoma Father.  Other Problems Inguinal Hernia Other disease, cancer, significant illness  Review of Systems  General Not Present- Appetite Loss, Chills, Fatigue, Fever, Night Sweats, Weight Gain and Weight Loss. Skin Not Present- Change in Wart/Mole, Dryness, Hives, Jaundice, New Lesions, Non-Healing Wounds, Rash and Ulcer. HEENT Not Present- Earache, Hearing Loss, Hoarseness, Nose Bleed, Oral Ulcers, Ringing in the Ears, Seasonal Allergies, Sinus Pain, Sore Throat, Visual Disturbances, Wears glasses/contact lenses and Yellow Eyes. Respiratory Present- Chronic Cough. Not Present- Bloody  sputum, Difficulty Breathing, Snoring and Wheezing. Breast Not Present- Breast Mass, Breast Pain, Nipple Discharge and Skin Changes. Cardiovascular Not Present- Chest Pain, Difficulty Breathing Lying Down, Leg Cramps, Palpitations, Rapid Heart Rate, Shortness of Breath and Swelling of Extremities. Gastrointestinal Not Present- Abdominal Pain, Bloating, Bloody Stool, Change in Bowel Habits, Chronic diarrhea, Constipation, Difficulty Swallowing, Excessive gas, Gets full quickly at meals, Hemorrhoids, Indigestion, Nausea, Rectal Pain and Vomiting. Male Genitourinary Not Present- Blood in Urine, Change in Urinary Stream, Frequency, Impotence, Nocturia, Painful Urination, Urgency and Urine Leakage. Musculoskeletal Not Present- Back Pain, Joint Pain, Joint Stiffness, Muscle Pain, Muscle Weakness and Swelling of Extremities. Neurological Not Present- Decreased Memory, Fainting, Headaches, Numbness, Seizures, Tingling, Tremor, Trouble walking and Weakness. Psychiatric Not Present- Anxiety, Bipolar, Change in Sleep Pattern, Depression, Fearful and Frequent crying. Endocrine Not Present- Cold Intolerance, Excessive Hunger, Hair Changes, Heat Intolerance, Hot flashes and New Diabetes. Hematology Not Present- Blood Thinners, Easy Bruising, Excessive bleeding, Gland problems, HIV and Persistent Infections. All other systems negative  Vitals Weight: 226.5 lb Height: 76in Body Surface Area: 2.34 m Body Mass Index: 27.57 kg/m  Temp.: 97.37F  Pulse: 64 (Regular)  P.OX: 99% (Room air)  Alert and well-appearing, respirations. Abdomen is soft and nontender. There is a reducible left inguinal hernia.    Assessment & Plan   LEFT INGUINAL HERNIA (K40.90) Story: We discussed the relevant anatomy and we discussed the techniques of both the open and laparoscopic/robotic approach to repair. Discussed risks of bleeding, infection, pain, scarring, injury to structures in the area including nerves, blood  vessels, bowel, bladder, risk of chronic pain, hernia recurrence, risk of seroma or hematoma, urinary retention, and risks of general anesthesia including cardiovascular, pulmonary, and thromboembolic complications. Discussed activity limitations postoperatively after both surgeries. For this unilateral non-recurrent hernia I recommend an open approach to which she is amenable. Questions were answered. Patient wishes to proceed with  scheduling.

## 2020-10-03 NOTE — H&P (Signed)
[facility]  [address] [place], [place] [address]     Caprice Red    Date of Visit:  09/05/2016  Date of Birth: 02-16-1966  Age: 55 yrs.   Medical Record Number: [id number]  Referring Physician: Gwendalyn Ege MD, [facility].  __   CURRENT DIAGNOSES     1. Shortness of breath, R06.02  2. Abnormal electrocardiogram, R94.31  __  ALLERGIES     No Known Drug Allergies  __  MEDICATIONS     1. No Meds  __   CHIEF COMPLAINT/REASON FOR VISIT  SOB  __  HISTORY OF PRESENT ILLNESS  I had the pleasure of  seeing Mr. Cody Davenport in our Cardiovascular Clinic for a consultation at the request of Dr. Gwendalyn Ege for evaluation of shortness of breath. The patient is a pleasant 55 year old male with no significant past medical history, who is very active and does  significant amounts of exercise, however he has noticed a significant amount of shortness of breath according to him. He states that this is unusual for him. He denies any chest pain. He states that there is a family history of coronary disease and his  father had coronary disease at age 11. He denies any orthopnea or PND. He denies any smoking. He has had lipid panel checked by Dr. Gwendalyn Ege; the results are not back yet. 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 His  physical exam is fairly unremarkable. His blood pressures are fairly well controlled.     Interestingly the patient also does report some wheezing with exercise.   __  PAST HISTORY      Past Medical Illnesses: No previous history of significant medical illnesses.;  Past Cardiac Illnesses : No previous history of cardiac disease.; Infectious Diseases: Chicken Pox; Surgical Procedures : Appendectomy; Trauma History: Fracture of the Ankle; Cardiology Procedures-Invasive : No previous interventional or invasive cardiology procedures.; Cardiology Procedures-Noninvasive: No previous non-invasive cardiovascular testing.     __  CARDIAC RISK FACTORS     Tobacco  Abuse: has never used tobacco;  Family History of Heart Disease: positive; Hyperlipidemia: negative;  Hypertension: negative;  Diabetes Mellitus: negative;  Prior History of Heart Disease: negative; Obesity: negative;  Sedentary Life Style:negative; JWJ:[id number]; Menopausal :not applicable  __  SOCIAL HISTORY    Alcohol Use : drinks regularly and wine 1-2 per day; Smoking: Does not smoke; Never smoker ([id number]); Diet : Regular diet and Caffeine use-1-2 per day; Exercise: Exercises regularly;   __  REVIEW OF SYSTEMS     General: Denies recent weight loss, weight gain, fever or chills or change in exercise tolerance.; Integumentary : Denies any change in hair or nails, rashes, or skin lesions.; Eyes: Denies diplopia, glaucoma or visual  field defects.; Ears, Nose, Throat, Mouth: Denies any hearing loss, epistaxis, hoarseness or difficulty  speaking.;Respiratory: dyspnea with exertion; Cardiovascular : Please review HPI; Abdominal : Denies ulcer disease,  hematochezia or melena.;Musculoskeletal:Denies any venous insufficiency, arthritic symptoms or back problems.;  Neurological : Denies any recurrent strokes, TIA, or seizure disorder.; Psychiatric : Denies any depression, substance abuse or change in cognitive functions.; Endocrine:  Denies any weight change, heat/cold intolerance, polydipsia, or polyuria; Hematologic/Immunologic: Denies  any food allergies, seasonal allergies, bleeding disorders.  __  PHYSICAL EXAMINATION    Vital  Signs:  Blood Pressure:  132/74 Sitting, Left arm, regular cuff     Weight: 228.00 lbs.  Height: 76"  BMI:  28   Pulse: 63/min. Apical  Constitutional:  Cooperative, alert and oriented,well developed, well nourished, in no acute distress. Skin: Warm and dry to touch, no apparent skin lesions, or masses  noted. Head: Normocephalic, normal hair pattern, no masses or tenderness  Eyes: EOMS Intact, PERRL, conjunctivae and lids normal. Funduscopic exam and visual fields not performed. ENT  : Ears, Nose and throat reveal no gross abnormalities. No pallor or cyanosis. Dentition good. Neck:  No palpable masses or adenopathy, no thyromegaly, no JVD, carotid pulses are full and equal bilaterally without bruits. Chest: Normal symmetry, no tenderness  to palpation, normal respiratory excursion, no intercostal retraction, no use of accessory muscles, normal diaphragmatic excursion, clear to auscultation and percussion. Cardiac : Regular rhythm, S1 normal, S2 normal, No S3 or S4, Apical impulse not displaced, no murmurs, gallops or rubs detected. Abdomen: Abdomen soft, bowel sounds  normoactive, no masses, no hepatosplenomegaly, non-tender, no bruits Peripheral Pulses: The femoral,  popliteal, dorsalis pedis, and posterior tibial pulses are full and equal bilaterally with no bruits auscultated. Extremities/Back: No deformities, clubbing,  cyanosis, erythema or edema observed. There are no spinal abnormalities noted. Normal muscle strength and tone. Neurological:  No gross motor or sensory deficits noted, affect appropriate, oriented to time, person and place.       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     IMPRESSIONS:   1. Shortness of breath with exertion. According to  the patient this is unusual for him.   2. 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    RECOMMENDATION:  It is certainly  puzzling to me that he has shortness of breath with this activity, but his exercise regimen is quite intense. His electrocardiogram does show left anterior fascicular block; thus I would like to proceed with an echocardiogram to assess for any structural  heart disease. In addition, I will proceed with performing an exercise treadmill stress test for ischemia. 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 I will reassess him in one  month. If he  continues to have shortness of breath without any clear resolution and improvement, then I will proceed with a coronary CTA.     I would like to thank Dr. Gwendalyn Ege for allowing me to participate in the care of this patient.      Lynford Humphrey, MD, [facility]    AM/tutbh    cc: Talbert Nan MD     eca  ____________________________  TODAYS ORDERS  2D, color flow, doppler Today  Return Visit 15 MIN 1 month  Exercise Treadmill Bruce  At [facility]  12 Lead ECG Today  Diet mgmt edu, guidance and counseling TODAY

## 2020-10-18 ENCOUNTER — Encounter (HOSPITAL_COMMUNITY): Admission: RE | Admit: 2020-10-18 | Payer: No Typology Code available for payment source | Source: Ambulatory Visit

## 2020-10-18 NOTE — Patient Instructions (Addendum)
DUE TO COVID-19 ONLY ONE VISITOR IS ALLOWED TO COME WITH YOU AND STAY IN THE WAITING ROOM ONLY DURING PRE OP AND PROCEDURE DAY OF SURGERY. THE 1 VISITOR  MAY VISIT WITH YOU AFTER SURGERY IN YOUR PRIVATE ROOM DURING VISITING HOURS ONLY!  YOU NEED TO HAVE A COVID 19 TEST ON_4/25_____ @__2 :45pm_____, THIS TEST MUST BE DONE BEFORE SURGERY,  COVID TESTING SITE [address] [address], IT IS ON THE RIGHT GOING OUT [address] APPROXIMATELY  2 MINUTES PAST [employer] ON THE RIGHT. ONCE YOUR COVID TEST IS COMPLETED,  PLEASE BEGIN THE QUARANTINE INSTRUCTIONS AS OUTLINED IN YOUR HANDOUT.                Craig Burnett     Your procedure is scheduled on: 10/24/20   Report to [facility] Main  Entrance   Report to short stay at 5:15 AM     Call this number if you have problems the morning of surgery [phone]    Remember: Do not eat food or drink liquids :After Midnight.   BRUSH YOUR TEETH MORNING OF SURGERY AND RINSE YOUR MOUTH OUT, NO CHEWING GUM CANDY OR MINTS.     Take these medicines the morning of surgery with A SIP OF WATER: none                                 You may not have any metal on your body including              piercings  Do not wear jewelry,  lotions, powders or deodorant              Men may shave face and neck.   Do not bring valuables to the hospital. [place].  Contacts, dentures or bridgework may not be worn into surgery.      Patients discharged the day of surgery will not be allowed to drive home . IF YOU ARE HAVING SURGERY AND GOING HOME THE SAME DAY, YOU MUST HAVE AN ADULT TO DRIVE YOU HOME AND BE WITH YOU FOR 24 HOURS.  YOU MAY GO HOME BY TAXI OR UBER OR ORTHERWISE, BUT AN ADULT MUST ACCOMPANY YOU HOME AND STAY WITH YOU FOR 24 HOURS.  Name and phone number of your driver:  Special Instructions: N/A              Please read over the following fact sheets you were  given: _____________________________________________________________________             [facility] - Preparing for Surgery Before surgery, you can play an important role.  Because skin is not sterile, your skin needs to be as free of germs as possible.  You can reduce the number of germs on your skin by washing with CHG (chlorahexidine gluconate) soap before surgery.  CHG is an antiseptic cleaner which kills germs and bonds with the skin to continue killing germs even after washing. Please DO NOT use if you have an allergy to CHG or antibacterial soaps.  If your skin becomes reddened/irritated stop using the CHG and inform your nurse when you arrive at Short Stay. .  You may shave your face/neck.  Please follow these instructions carefully:  1.  Shower with CHG Soap the night before surgery and  the  morning of Surgery.  2.  If you choose to wash your hair, wash your hair first as usual with your  normal  shampoo.  3.  After you shampoo, rinse your hair and body thoroughly to remove the  shampoo.                                        4.  Use CHG as you would any other liquid soap.  You can apply chg directly  to the skin and wash                       Gently with a scrungie or clean washcloth.  5.  Apply the CHG Soap to your body ONLY FROM THE NECK DOWN.   Do not use on face/ open                           Wound or open sores. Avoid contact with eyes, ears mouth and genitals (private parts).                       Wash face,  Genitals (private parts) with your normal soap.             6.  Wash thoroughly, paying special attention to the area where your surgery  will be performed.  7.  Thoroughly rinse your body with warm water from the neck down.  8.  DO NOT shower/wash with your normal soap after using and rinsing off  the CHG Soap.             9.  Pat yourself dry with a clean towel.            10.  Wear clean pajamas.            11.  Place clean sheets on your bed the night of your first  shower and do not  sleep with pets. Day of Surgery : Do not apply any lotions/deodorants the morning of surgery.  Please wear clean clothes to the hospital/surgery center.  FAILURE TO FOLLOW THESE INSTRUCTIONS MAY RESULT IN THE CANCELLATION OF YOUR SURGERY PATIENT SIGNATURE_________________________________  NURSE SIGNATURE__________________________________  ________________________________________________________________________

## 2020-10-19 ENCOUNTER — Other Ambulatory Visit: Payer: Self-pay

## 2020-10-19 ENCOUNTER — Encounter (HOSPITAL_COMMUNITY): Payer: Self-pay

## 2020-10-19 ENCOUNTER — Encounter (HOSPITAL_COMMUNITY)
Admission: RE | Admit: 2020-10-19 | Discharge: 2020-10-19 | Disposition: A | Discharge status: Home / Self Care | Payer: No Typology Code available for payment source | Source: Ambulatory Visit | Attending: Surgery | Admitting: Surgery

## 2020-10-19 DIAGNOSIS — I493 Ventricular premature depolarization: Secondary | ICD-10-CM | POA: Diagnosis not present

## 2020-10-19 DIAGNOSIS — Z01818 Encounter for other preprocedural examination: Secondary | ICD-10-CM | POA: Insufficient documentation

## 2020-10-19 LAB — BASIC METABOLIC PANEL
Anion gap: 10 (ref 5–15)
BUN: 16 mg/dL (ref 6–20)
CO2: 26 mmol/L (ref 22–32)
Calcium: 9.4 mg/dL (ref 8.9–10.3)
Chloride: 105 mmol/L (ref 98–111)
Creatinine, Ser: 0.89 mg/dL (ref 0.61–1.24)
GFR, Estimated: >60 mL/min (ref >60)
Glucose, Bld: 96 mg/dL (ref 70–99)
Potassium: 4.1 mmol/L (ref 3.5–5.1)
Sodium: 141 mmol/L (ref 135–145)

## 2020-10-19 LAB — CBC
HCT: 44.6 % (ref 39.0–52.0)
Hemoglobin: 15.1 g/dL (ref 13.0–17.0)
MCH: 31.1 pg (ref 26.0–34.0)
MCHC: 33.9 g/dL (ref 30.0–36.0)
MCV: 92 fL (ref 80.0–100.0)
Platelets: 254 10*3/uL (ref 150–400)
RBC: 4.85 MIL/uL (ref 4.22–5.81)
RDW: 12.7 % (ref 11.5–15.5)
WBC: 5.3 10*3/uL (ref 4.0–10.5)
nRBC: 0 % (ref 0.0–0.2)

## 2020-10-19 NOTE — Progress Notes (Signed)
COVID Vaccine Completed:yes Date COVID Vaccine completed:05/2020 COVID vaccine manufacturer: Pfizer     PCP - Dr. Tommie Ard Cardiologist - none  Chest x-ray - 07/20/20-epic EKG - 10/19/20-chart, epic Stress Test - no ECHO - 09/15/16-care everywhere Cardiac Cath - no Pacemaker/ICD device last checked:NA  Sleep Study - no CPAP -   Fasting Blood Sugar - NA Checks Blood Sugar _____ times a day  Blood Thinner Instructions:NA Aspirin Instructions: Last Dose:  Anesthesia review:   Patient denies shortness of breath, fever, cough and chest pain at PAT appointment Yes. Pt has a very active lifestile. No SOB with any activities  Patient verbalized understanding of instructions that were given to them at the PAT appointment. Patient was also instructed that they will need to review over the PAT instructions again at home before surgery.Yes

## 2020-10-22 ENCOUNTER — Other Ambulatory Visit (HOSPITAL_COMMUNITY)
Admission: RE | Admit: 2020-10-22 | Discharge: 2020-10-22 | Disposition: A | Discharge status: Home / Self Care | Payer: No Typology Code available for payment source | Source: Ambulatory Visit | Attending: Surgery | Admitting: Surgery

## 2020-10-22 DIAGNOSIS — Z01812 Encounter for preprocedural laboratory examination: Secondary | ICD-10-CM | POA: Insufficient documentation

## 2020-10-22 DIAGNOSIS — Z20822 Contact with and (suspected) exposure to covid-19: Secondary | ICD-10-CM | POA: Diagnosis not present

## 2020-10-22 LAB — SARS CORONAVIRUS 2 (TAT 6-24 HRS): SARS Coronavirus 2: NEGATIVE

## 2020-10-23 MED ORDER — BUPIVACAINE LIPOSOME 1.3 % IJ SUSP
20.0000 mL | Freq: Once | INTRAMUSCULAR | Status: DC
  Filled 2020-10-23: qty 20

## 2020-10-23 NOTE — Anesthesia Preprocedure Evaluation (Addendum)
Anesthesia Evaluation  Patient identified by MRN, date of birth, ID band Patient awake    Reviewed: Allergy & Precautions, NPO status , Patient's Chart, lab work & pertinent test results  Airway Mallampati: II  TM Distance: >3 FB Neck ROM: Full    Dental no notable dental hx.    Pulmonary neg pulmonary ROS,    Pulmonary exam normal breath sounds clear to auscultation       Cardiovascular negative cardio ROS Normal cardiovascular exam Rhythm:Regular Rate:Normal     Neuro/Psych negative neurological ROS  negative psych ROS   GI/Hepatic Neg liver ROS, Inguinal hernia   Endo/Other  negative endocrine ROS  Renal/GU negative Renal ROS  negative genitourinary   Musculoskeletal negative musculoskeletal ROS (+)   Abdominal   Peds negative pediatric ROS (+)  Hematology negative hematology ROS (+)   Anesthesia Other Findings   Reproductive/Obstetrics negative OB ROS                            Anesthesia Physical Anesthesia Plan  ASA: I  Anesthesia Plan: General   Post-op Pain Management:    Induction:   PONV Risk Score and Plan: 2 and Treatment may vary due to age or medical condition, Ondansetron, Dexamethasone and Midazolam  Airway Management Planned: Oral ETT  Additional Equipment: None  Intra-op Plan:   Post-operative Plan: Extubation in OR  Informed Consent: I have reviewed the patients History and Physical, chart, labs and discussed the procedure including the risks, benefits and alternatives for the proposed anesthesia with the patient or authorized representative who has indicated his/her understanding and acceptance.       Plan Discussed with: CRNA, Anesthesiologist and Surgeon  Anesthesia Plan Comments:        Anesthesia Quick Evaluation

## 2020-10-24 ENCOUNTER — Ambulatory Visit (HOSPITAL_COMMUNITY): Payer: No Typology Code available for payment source | Admitting: Anesthesiology

## 2020-10-24 ENCOUNTER — Encounter (HOSPITAL_COMMUNITY)
Admission: RE | Disposition: A | Discharge status: Home / Self Care | Payer: Self-pay | Source: Ambulatory Visit | Attending: Surgery

## 2020-10-24 ENCOUNTER — Encounter (HOSPITAL_COMMUNITY): Payer: Self-pay | Admitting: Surgery

## 2020-10-24 ENCOUNTER — Ambulatory Visit (HOSPITAL_COMMUNITY)
Admission: RE | Admit: 2020-10-24 | Discharge: 2020-10-24 | Disposition: A | Discharge status: Home / Self Care | Payer: No Typology Code available for payment source | Source: Ambulatory Visit | Attending: Surgery | Admitting: Surgery

## 2020-10-24 DIAGNOSIS — K4090 Unilateral inguinal hernia, without obstruction or gangrene, not specified as recurrent: Secondary | ICD-10-CM | POA: Insufficient documentation

## 2020-10-24 DIAGNOSIS — D176 Benign lipomatous neoplasm of spermatic cord: Secondary | ICD-10-CM | POA: Diagnosis not present

## 2020-10-24 DIAGNOSIS — Z7951 Long term (current) use of inhaled steroids: Secondary | ICD-10-CM | POA: Diagnosis not present

## 2020-10-24 DIAGNOSIS — Z79899 Other long term (current) drug therapy: Secondary | ICD-10-CM | POA: Diagnosis not present

## 2020-10-24 HISTORY — PX: INGUINAL HERNIA REPAIR: SHX194

## 2020-10-24 SURGERY — REPAIR, HERNIA, INGUINAL, ADULT
Anesthesia: General | Laterality: Left

## 2020-10-24 MED ORDER — OXYCODONE HCL 5 MG PO TABS: 5.0000 mg | ORAL_TABLET | Freq: Three times a day (TID) | ORAL | 0 refills | Status: AC | PRN

## 2020-10-24 MED ORDER — ORAL CARE MOUTH RINSE: 15.0000 mL | Freq: Once | OROMUCOSAL | Status: AC

## 2020-10-24 MED ORDER — CHLORHEXIDINE GLUCONATE 4 % EX LIQD: 60.0000 mL | Freq: Once | CUTANEOUS | Status: DC

## 2020-10-24 MED ORDER — OXYCODONE HCL 5 MG/5ML PO SOLN: 5.0000 mg | Freq: Once | ORAL | Status: DC | PRN

## 2020-10-24 MED ORDER — MIDAZOLAM HCL 2 MG/2ML IJ SOLN
INTRAMUSCULAR | Status: AC
  Filled 2020-10-24: qty 2

## 2020-10-24 MED ORDER — SODIUM CHLORIDE (PF) 0.9 % IJ SOLN
INTRAMUSCULAR | Status: AC
  Filled 2020-10-24: qty 10

## 2020-10-24 MED ORDER — LIDOCAINE 2% (20 MG/ML) 5 ML SYRINGE
INTRAMUSCULAR | Status: DC | PRN
  Administered 2020-10-24 (×2): 50 mg via INTRAVENOUS

## 2020-10-24 MED ORDER — LACTATED RINGERS IV SOLN: INTRAVENOUS | Status: DC

## 2020-10-24 MED ORDER — 0.9 % SODIUM CHLORIDE (POUR BTL) OPTIME
TOPICAL | Status: DC | PRN
  Administered 2020-10-24: 1000 mL

## 2020-10-24 MED ORDER — CHLORHEXIDINE GLUCONATE 0.12 % MT SOLN
15.0000 mL | Freq: Once | OROMUCOSAL | Status: AC
  Administered 2020-10-24: 15 mL via OROMUCOSAL

## 2020-10-24 MED ORDER — CEFAZOLIN SODIUM-DEXTROSE 2-4 GM/100ML-% IV SOLN
2.0000 g | INTRAVENOUS | Status: AC
  Administered 2020-10-24: 2 g via INTRAVENOUS
  Filled 2020-10-24: qty 100

## 2020-10-24 MED ORDER — ACETAMINOPHEN 500 MG PO TABS
1000.0000 mg | ORAL_TABLET | ORAL | Status: AC
  Administered 2020-10-24: 1000 mg via ORAL
  Filled 2020-10-24: qty 2

## 2020-10-24 MED ORDER — ONDANSETRON HCL 4 MG/2ML IJ SOLN
INTRAMUSCULAR | Status: AC
  Filled 2020-10-24: qty 2

## 2020-10-24 MED ORDER — ROCURONIUM BROMIDE 10 MG/ML (PF) SYRINGE
PREFILLED_SYRINGE | INTRAVENOUS | Status: DC | PRN
  Administered 2020-10-24: 10 mg via INTRAVENOUS
  Administered 2020-10-24: 70 mg via INTRAVENOUS

## 2020-10-24 MED ORDER — PROMETHAZINE HCL 25 MG/ML IJ SOLN: 6.2500 mg | INTRAMUSCULAR | Status: DC | PRN

## 2020-10-24 MED ORDER — BUPIVACAINE LIPOSOME 1.3 % IJ SUSP
INTRAMUSCULAR | Status: DC | PRN
  Administered 2020-10-24: 20 mL

## 2020-10-24 MED ORDER — PROPOFOL 10 MG/ML IV BOLUS
INTRAVENOUS | Status: AC
  Filled 2020-10-24: qty 20

## 2020-10-24 MED ORDER — MIDAZOLAM HCL 2 MG/2ML IJ SOLN
INTRAMUSCULAR | Status: DC | PRN
  Administered 2020-10-24: 2 mg via INTRAVENOUS

## 2020-10-24 MED ORDER — DEXAMETHASONE SODIUM PHOSPHATE 10 MG/ML IJ SOLN
INTRAMUSCULAR | Status: DC | PRN
  Administered 2020-10-24: 4 mg via INTRAVENOUS

## 2020-10-24 MED ORDER — DEXAMETHASONE SODIUM PHOSPHATE 10 MG/ML IJ SOLN
INTRAMUSCULAR | Status: AC
  Filled 2020-10-24: qty 1

## 2020-10-24 MED ORDER — LIDOCAINE 2% (20 MG/ML) 5 ML SYRINGE
INTRAMUSCULAR | Status: AC
  Filled 2020-10-24: qty 5

## 2020-10-24 MED ORDER — ONDANSETRON HCL 4 MG/2ML IJ SOLN
INTRAMUSCULAR | Status: DC | PRN
  Administered 2020-10-24: 4 mg via INTRAVENOUS

## 2020-10-24 MED ORDER — SODIUM CHLORIDE 0.9 % IV SOLN
INTRAVENOUS | Status: DC | PRN
  Administered 2020-10-24: 20 mL via INTRAMUSCULAR

## 2020-10-24 MED ORDER — PROPOFOL 10 MG/ML IV BOLUS
INTRAVENOUS | Status: DC | PRN
  Administered 2020-10-24: 190 mg via INTRAVENOUS

## 2020-10-24 MED ORDER — SUGAMMADEX SODIUM 200 MG/2ML IV SOLN
INTRAVENOUS | Status: DC | PRN
  Administered 2020-10-24: 200 mg via INTRAVENOUS

## 2020-10-24 MED ORDER — FENTANYL CITRATE (PF) 100 MCG/2ML IJ SOLN
INTRAMUSCULAR | Status: AC
  Filled 2020-10-24: qty 2

## 2020-10-24 MED ORDER — GABAPENTIN 300 MG PO CAPS
300.0000 mg | ORAL_CAPSULE | ORAL | Status: AC
  Administered 2020-10-24: 300 mg via ORAL
  Filled 2020-10-24: qty 1

## 2020-10-24 MED ORDER — ACETAMINOPHEN 500 MG PO TABS: 1000.0000 mg | ORAL_TABLET | Freq: Once | ORAL | Status: DC

## 2020-10-24 MED ORDER — OXYCODONE HCL 5 MG PO TABS: 5.0000 mg | ORAL_TABLET | Freq: Once | ORAL | Status: DC | PRN

## 2020-10-24 MED ORDER — FENTANYL CITRATE (PF) 100 MCG/2ML IJ SOLN
INTRAMUSCULAR | Status: DC | PRN
  Administered 2020-10-24 (×2): 50 ug via INTRAVENOUS

## 2020-10-24 MED ORDER — DROPERIDOL 2.5 MG/ML IJ SOLN: 0.6250 mg | Freq: Once | INTRAMUSCULAR | Status: DC | PRN

## 2020-10-24 MED ORDER — FENTANYL CITRATE (PF) 100 MCG/2ML IJ SOLN: 25.0000 ug | INTRAMUSCULAR | Status: DC | PRN

## 2020-10-24 MED ORDER — DOCUSATE SODIUM 100 MG PO CAPS: 100.0000 mg | ORAL_CAPSULE | Freq: Two times a day (BID) | ORAL | 0 refills | Status: AC

## 2020-10-24 SURGICAL SUPPLY — 39 items
BENZOIN TINCTURE PRP APPL 2/3 (GAUZE/BANDAGES/DRESSINGS) ×2 IMPLANT
BLADE SURG 15 STRL LF DISP TIS (BLADE) ×1 IMPLANT
BLADE SURG 15 STRL SS (BLADE) ×2
CHLORAPREP W/TINT 26 (MISCELLANEOUS) ×2 IMPLANT
CLSR STERI-STRIP ANTIMIC 1/2X4 (GAUZE/BANDAGES/DRESSINGS) ×2 IMPLANT
COVER SURGICAL LIGHT HANDLE (MISCELLANEOUS) ×2 IMPLANT
COVER WAND RF STERILE (DRAPES) IMPLANT
DECANTER SPIKE VIAL GLASS SM (MISCELLANEOUS) ×2 IMPLANT
DRAIN PENROSE 0.5X18 (DRAIN) ×2 IMPLANT
DRAPE LAPAROSCOPIC ABDOMINAL (DRAPES) ×2 IMPLANT
ELECT REM PT RETURN 15FT ADLT (MISCELLANEOUS) ×2 IMPLANT
GAUZE SPONGE 4X4 12PLY STRL (GAUZE/BANDAGES/DRESSINGS) ×2 IMPLANT
GLOVE SURG ENC MOIS LTX SZ6 (GLOVE) ×2 IMPLANT
GLOVE SURG UNDER LTX SZ6.5 (GLOVE) ×2 IMPLANT
GOWN STRL REUS W/TWL LRG LVL3 (GOWN DISPOSABLE) ×2 IMPLANT
GOWN STRL REUS W/TWL XL LVL3 (GOWN DISPOSABLE) ×2 IMPLANT
KIT BASIN OR (CUSTOM PROCEDURE TRAY) ×2 IMPLANT
KIT TURNOVER KIT A (KITS) ×2 IMPLANT
MESH ULTRAPRO 3X6 7.6X15CM (Mesh General) ×2 IMPLANT
NEEDLE HYPO 22GX1.5 SAFETY (NEEDLE) ×2 IMPLANT
PACK BASIC VI WITH GOWN DISP (CUSTOM PROCEDURE TRAY) ×2 IMPLANT
PENCIL SMOKE EVACUATOR (MISCELLANEOUS) IMPLANT
SPONGE LAP 4X18 RFD (DISPOSABLE) ×2 IMPLANT
STRIP CLOSURE SKIN 1/2X4 (GAUZE/BANDAGES/DRESSINGS) ×2 IMPLANT
SUT ETHIBOND 0 MO6 C/R (SUTURE) ×2 IMPLANT
SUT MNCRL AB 4-0 PS2 18 (SUTURE) ×2 IMPLANT
SUT PDS AB 0 CT1 36 (SUTURE) ×4 IMPLANT
SUT SILK 3 0 (SUTURE) ×1
SUT SILK 3-0 18XBRD TIE 12 (SUTURE) ×1 IMPLANT
SUT VIC AB 3-0 SH 27 (SUTURE) ×4
SUT VIC AB 3-0 SH 27XBRD (SUTURE) ×2 IMPLANT
SUT VICRYL 0 UR6 27IN ABS (SUTURE) IMPLANT
SUT VICRYL 3 0 BR 18  UND (SUTURE) ×1
SUT VICRYL 3 0 BR 18 UND (SUTURE) ×1 IMPLANT
SYR CONTROL 10ML LL (SYRINGE) ×2 IMPLANT
TAPE PAPER 3X10 WHT MICROPORE (GAUZE/BANDAGES/DRESSINGS) ×2 IMPLANT
TOWEL OR 17X26 10 PK STRL BLUE (TOWEL DISPOSABLE) ×2 IMPLANT
TOWEL OR NON WOVEN STRL DISP B (DISPOSABLE) ×2 IMPLANT
TRAY FOLEY MTR SLVR 16FR STAT (SET/KITS/TRAYS/PACK) IMPLANT

## 2020-10-24 NOTE — Transfer of Care (Signed)
Immediate Anesthesia Transfer of Care Note  Patient: Craig Burnett  Procedure(s) Performed: OPEN LEFT INGUINAL HERNIA REPAIR (Left )  Patient Location: PACU  Anesthesia Type:General  Level of Consciousness: awake, alert  and oriented  Airway & Oxygen Therapy: Patient Spontanous Breathing and Patient connected to face mask oxygen  Post-op Assessment: Report given to RN, Post -op Vital signs reviewed and stable and Patient moving all extremities X 4  Post vital signs: Reviewed and stable  Last Vitals:  Vitals Value Taken Time  BP 134/86 10/24/20 0855  Temp    Pulse 55 10/24/20 0856  Resp 12 10/24/20 0856  SpO2 100 % 10/24/20 0856  Vitals shown include unvalidated device data.  Last Pain:  Vitals:   10/24/20 0553  TempSrc: Oral  PainSc: 0-No pain         Complications: No complications documented.

## 2020-10-24 NOTE — Anesthesia Procedure Notes (Signed)
Procedure Name: Intubation Date/Time: 10/24/2020 7:32 AM Performed by: Niel Hummer, CRNA Pre-anesthesia Checklist: Patient identified, Emergency Drugs available, Suction available and Patient being monitored Patient Re-evaluated:Patient Re-evaluated prior to induction Oxygen Delivery Method: Circle system utilized Preoxygenation: Pre-oxygenation with 100% oxygen Induction Type: IV induction Ventilation: Mask ventilation without difficulty Laryngoscope Size: Mac and 4 Grade View: Grade I Tube type: Oral Tube size: 7.5 mm Number of attempts: 1 Airway Equipment and Method: Stylet Placement Confirmation: ETT inserted through vocal cords under direct vision,  positive ETCO2 and breath sounds checked- equal and bilateral Secured at: 24 cm Tube secured with: Tape Dental Injury: Teeth and Oropharynx as per pre-operative assessment

## 2020-10-24 NOTE — Discharge Instructions (Signed)
HERNIA REPAIR: POST OP INSTRUCTIONS   EAT Gradually transition to a high fiber diet with a fiber supplement over the next few weeks after discharge.  Start with a pureed / full liquid diet (see below)  WALK Walk an hour a day (cumulative- not all at once).  Control your pain to do that.    CONTROL PAIN Control pain so that you can walk, sleep, tolerate sneezing/coughing, and go up/down stairs.  HAVE A BOWEL MOVEMENT DAILY Keep your bowels regular to avoid problems.  OK to try a laxative to override constipation.  OK to use an antidairrheal to slow down diarrhea.  Call if not better after 2 tries  CALL IF YOU HAVE PROBLEMS/CONCERNS Call if you are still struggling despite following these instructions. Call if you have concerns not answered by these instructions  ######################################################################    1. DIET: Follow a light bland diet & liquids the first 24 hours after arrival home, such as soup, liquids, starches, etc.  Be sure to drink plenty of fluids.  Quickly advance to a usual solid diet within a few days.  Avoid fast food or heavy meals as your are more likely to get nauseated or have irregular bowels.  A low-sugar, high-fiber diet for the rest of your life is ideal.   2. Take your usually prescribed home medications unless otherwise directed.  3. PAIN CONTROL: a. Pain is best controlled by a usual combination of three different methods TOGETHER: i. Ice/Heat ii. Over the counter pain medication iii. Prescription pain medication b. Most patients will experience some swelling and bruising around the hernia(s) such as the bellybutton, groins, or old incisions.  Ice packs or heating pads (30-60 minutes up to 6 times a day) will help. Use ice for the first few days to help decrease swelling and bruising, then switch to heat to help relax tight/sore spots and speed recovery.  Some people prefer to use ice alone, heat alone, alternating between ice &  heat.  Experiment to what works for you.  Swelling and bruising can take several weeks to resolve.   c. It is helpful to take an over-the-counter pain medication regularly for the first days: i. Naproxen (Aleve, etc)  Two 220mg  tabs twice a day OR Ibuprofen (Advil, etc) Three 200mg  tabs four times a day (every meal & bedtime) AND ii. Acetaminophen (Tylenol, etc) 325-650mg  four times a day (every meal & bedtime). It is OK to take tylenol and ibuprofen/naproxen at the same time, or you can alternate them.  d. A  prescription for pain medication should be given to you upon discharge.  Take your pain medication as prescribed, IF NEEDED.  i. If you are having problems/concerns with the prescription medicine (does not control pain, nausea, vomiting, rash, itching, etc), please call us [phone] to see if we need to switch you to a different pain medicine that will work better for you and/or control your side effect better. ii. If you need a refill on your pain medication, please contact your pharmacy.  They will contact our office to request authorization. Prescriptions will not be filled after 5 pm or on week-ends.  4. Avoid getting constipated.  Between the surgery and the pain medications, it is common to experience some constipation.  Increasing fluid intake and taking a fiber supplement (such as Metamucil, Citrucel, FiberCon, MiraLax, etc) 1-2 times a day regularly will usually help prevent this problem from occurring.  A mild laxative (prune juice, Milk of Magnesia, MiraLax, etc) should be  taken according to package directions if there are no bowel movements after 48 hours.    5. Wash / shower every day, starting 2 days after surgery.  You may shower over the steri strips which are waterproof.    6. Remove your outer bandage 2 days after surgery. Steri strips will peel off after 1-2 weeks. You may leave the incision open to air.  You may replace a dressing/Band-Aid to cover an incision for comfort  if you wish.  Continue to shower over incision(s) after the dressing is off.  7. ACTIVITIES as tolerated:   a. You may resume regular (light) daily activities beginning the next day--such as daily self-care, walking, climbing stairs--gradually increasing activities as tolerated.  Control your pain so that you can walk an hour a day.  If you can walk 30 minutes without difficulty, it is safe to try more intense activity such as jogging, treadmill, bicycling, low-impact aerobics, swimming, etc. b. Refrain from the most intensive and strenuous activity such as sit-ups, heavy lifting, contact sports, etc  Refrain from any heavy lifting or straining until 6 weeks after surgery.   c. DO NOT PUSH THROUGH PAIN.  Let pain be your guide: If it hurts to do something, don't do it.  Pain is your body warning you to avoid that activity for another week until the pain goes down. d. You may drive when you are no longer taking prescription pain medication, you can comfortably wear a seatbelt, and you can safely maneuver your car and apply brakes. e. Dennis Bast may have sexual intercourse when it is comfortable.   8. FOLLOW UP in our office a. Please call [facility] at [phone] to set up an appointment to see your surgeon in the office for a follow-up appointment approximately 2-3 weeks after your surgery. b. Make sure that you call for this appointment the day you arrive home to insure a convenient appointment time.  9.  If you have disability of FMLA / Family leave forms, please bring the forms to the office for processing.  (do not give to your surgeon).  WHEN TO CALL us [phone]: 1. Poor pain control 2. Reactions / problems with new medications (rash/itching, nausea, etc)  3. Fever over 101.5 F (38.5 C) 4. Inability to urinate 5. Nausea and/or vomiting 6. Worsening swelling or bruising 7. Continued bleeding from incision. 8. Increased pain, redness, or drainage from the incision   The clinic staff is  available to answer your questions during regular business hours (8:30am-5pm).  Please don't hesitate to call and ask to speak to one of our nurses for clinical concerns.   If you have a medical emergency, go to the nearest emergency room or call 911.  A surgeon from [facility] is always on call at the hospitals in [facility], [place], [place], [place], [place]  [address] ?  [address] [address], [place], [place]   [address] MAIN: [phone] ? TOLL FREE: [phone] ? FAX: [phone] [email or web address]

## 2020-10-24 NOTE — H&P (Signed)
Craig Burnett  DOB: 04/01/1966 Married / Language: English / Race: White Male  History of Present Illness  Patient words: 54-year-old man with history of chronic bronchitis (recently diagnosed, improved with breo-ellipta) but no significant medical history presents for evaluation of a left inguinal hernia. Has been present for quite some time that has been increasingly bothersome with discomfort recently. 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 He did have a right inguinal hernia repair as a child. Denies any gastrointestinal or bladder symptoms.  Past Surgical History  Foot Surgery Bilateral. Open Inguinal Hernia Surgery Right. Vasectomy  Diagnostic Studies History  Colonoscopy 1-5 years ago  Allergies  No Known Drug Allergies   Medication History Fluticasone Furoate (27.5MCG/SPRAY Suspension, Nasal) Active. Fluticasone-Salmeterol (113-14MCG/ACT Aero Pow Br Act, Inhalation) Active. Medications Reconciled  Social History Alcohol use Occasional alcohol use. Caffeine use Coffee. No drug use Tobacco use Never smoker.  Family History Arthritis Father. Depression Brother, Father. Heart Disease Father. Heart disease in male family member before age 55 Hypertension Brother. Melanoma Father.  Other Problems Inguinal Hernia Other disease, cancer, significant illness  Review of Systems  General Not Present- Appetite Loss, Chills, Fatigue, Fever, Night Sweats, Weight Gain and Weight Loss. Skin Not Present- Change in Wart/Mole, Dryness, Hives, Jaundice, New Lesions, Non-Healing Wounds, Rash and Ulcer. HEENT Not Present- Earache, Hearing Loss, Hoarseness, Nose Bleed, Oral Ulcers, Ringing in the Ears, Seasonal Allergies, Sinus Pain, Sore Throat, Visual Disturbances, Wears glasses/contact lenses and Yellow Eyes. Respiratory Present- Chronic Cough. Not Present- Bloody  sputum, Difficulty Breathing, Snoring and Wheezing. Breast Not Present- Breast Mass, Breast Pain, Nipple Discharge and Skin Changes. Cardiovascular Not Present- Chest Pain, Difficulty Breathing Lying Down, Leg Cramps, Palpitations, Rapid Heart Rate, Shortness of Breath and Swelling of Extremities. Gastrointestinal Not Present- Abdominal Pain, Bloating, Bloody Stool, Change in Bowel Habits, Chronic diarrhea, Constipation, Difficulty Swallowing, Excessive gas, Gets full quickly at meals, Hemorrhoids, Indigestion, Nausea, Rectal Pain and Vomiting. Male Genitourinary Not Present- Blood in Urine, Change in Urinary Stream, Frequency, Impotence, Nocturia, Painful Urination, Urgency and Urine Leakage. Musculoskeletal Not Present- Back Pain, Joint Pain, Joint Stiffness, Muscle Pain, Muscle Weakness and Swelling of Extremities. Neurological Not Present- Decreased Memory, Fainting, Headaches, Numbness, Seizures, Tingling, Tremor, Trouble walking and Weakness. Psychiatric Not Present- Anxiety, Bipolar, Change in Sleep Pattern, Depression, Fearful and Frequent crying. Endocrine Not Present- Cold Intolerance, Excessive Hunger, Hair Changes, Heat Intolerance, Hot flashes and New Diabetes. Hematology Not Present- Blood Thinners, Easy Bruising, Excessive bleeding, Gland problems, HIV and Persistent Infections. All other systems negative  Vitals Weight: 226.5 lb Height: 76in Body Surface Area: 2.34 m Body Mass Index: 27.57 kg/m  Temp.: 97.3F  Pulse: 64 (Regular)  P.OX: 99% (Room air)  Alert and well-appearing, respirations. Abdomen is soft and nontender. There is a reducible left inguinal hernia.    Assessment & Plan   LEFT INGUINAL HERNIA (K40.90) Story: We discussed the relevant anatomy and we discussed the techniques of both the open and laparoscopic/robotic approach to repair. Discussed risks of bleeding, infection, pain, scarring, injury to structures in the area including nerves, blood  vessels, bowel, bladder, risk of chronic pain, hernia recurrence, risk of seroma or hematoma, urinary retention, and risks of general anesthesia including cardiovascular, pulmonary, and thromboembolic complications. Discussed activity limitations postoperatively after both surgeries. For this unilateral non-recurrent hernia I recommend an open approach to which she is amenable. Questions were answered. Patient wishes to proceed with   scheduling. 

## 2020-10-24 NOTE — Op Note (Signed)
Operative Note  Craig Burnett  [id number]  [id number]  10/24/2020   Surgeon: Clovis Riley MD FACS   Assistant: Caryn Bee MD, PGY-6   Procedure performed: Open left indirect inguinal hernia repair with UltraPro mesh   Preop diagnosis:  left inguinal hernia   Post-op diagnosis/intraop findings: Left indirect hernia, lipoma of the cord   Specimens: none   EBL: 5cc   Complications: none   Description of procedure: After obtaining informed consent the patient was taken to the operating room and placed supine on operating room table where general anesthesia was initiated, preoperative antibiotics were administered, SCDs applied, and a formal timeout was performed.  Foley catheter was inserted which is removed at the end of the case.  The groin was clipped, prepped and draped in the usual sterile fashion. An oblique incision was made in the just above the inguinal ligament after infiltrating the tissues with local anesthetic (Exparel). Soft tissues were dissected using electrocautery until the external oblique aponeurosis was encountered. This was divided sharply to expand the external ring. A plane was bluntly developed between the spermatic cord and the external oblique. The ilioinguinal nerve was identified, divided between hemostats and each end ligated with 3-0 Vicryl ties. The spermatic cord was then bluntly dissected away from the pubic tubercle and encircled with a Penrose. Inspection of the inguinal anatomy revealed a moderate indirect sac, laxity in the direct space but no overt hernia, and a small cord lipoma laterally. The indirect hernia sac was bluntly dissected away from the cord structures. Once we had affirmatively identified the sac, it was carefully opened in a region that was very thin. Inspection confirmed communication with the peritoneal cavity with no bowel contained currently. The sac was clamped and the excess excised at the level of the internal ring, where the  remaining sac was suture ligated with a 0 vicryl and reduced into the abdomen.    The cord lipoma was dissected down to the level of the inguinal floor where the pedicle was ligated with 3-0 Vicryl tie and the lipoma excised. A 3 x 6 piece of ultra Pro mesh was brought onto the field and trimmed to approximate the field. This was sutured to the pubic tubercle fascia using 0 ethibond. Interrupted 0 ethibonds were then used to suture the mesh to the inferior shelving edge and to the internal oblique superiorly. The tails of the mesh were wrapped around the spermatic cord, ensuring adequate room for the cord, and sutured to each other with 0 ethibond, and then directed laterally to lie flat beneath the external oblique aponeurosis. Hemostasis was ensured within the wound. The Penrose was removed. The external oblique aponeurosis was reapproximated with a running 3-0 Vicryl to re-create a narrowed external ring. More local was infiltrated around the pubic tubercle and in the plane just below the external oblique. The Scarpa's was reapproximated with interrupted 3-0 Vicryls. The skin was closed with interrupted deep dermal 3-0 Vicryl's and a running subcuticular 4-0 Monocryl. The remainder of the local was injected in the subcutaneous and subcuticular space. The field was then cleaned, benzoin and Steri-Strips and sterile bandage were applied. The patient was then awakened extubated and taken to PACU in stable condition.    All counts were correct at the completion of the case

## 2020-10-25 ENCOUNTER — Encounter (HOSPITAL_COMMUNITY): Payer: Self-pay | Admitting: Surgery

## 2020-10-25 NOTE — Anesthesia Postprocedure Evaluation (Signed)
Anesthesia Post Note  Patient: Craig Burnett  Procedure(s) Performed: OPEN LEFT INGUINAL HERNIA REPAIR (Left )     Patient location during evaluation: PACU Anesthesia Type: General Level of consciousness: awake and alert Pain management: pain level controlled Vital Signs Assessment: post-procedure vital signs reviewed and stable Respiratory status: spontaneous breathing, nonlabored ventilation and respiratory function stable Cardiovascular status: blood pressure returned to baseline and stable Postop Assessment: no apparent nausea or vomiting Anesthetic complications: no   No complications documented.  Last Vitals:  Vitals:   10/24/20 0921 10/24/20 0945  BP:  (!) 141/96  Pulse: (!) 54 (!) 56  Resp: 11 12  Temp: (!) 36.4 C (!) 36.4 C  SpO2: 97% 98%    Last Pain:  Vitals:   10/24/20 0945  TempSrc:   PainSc: 0-No pain   Pain Goal:                   Merlinda Frederick

## 2022-03-23 IMAGING — DX DG CHEST 2V
2 series · 2 of 2 positions shown · non-contrast
Comparison: Chest radiograph September 14, 2003.

CLINICAL DATA: Cough x3 months

EXAM:
CHEST - 2 VIEW

[dg chest 2 view (1 of 2)]
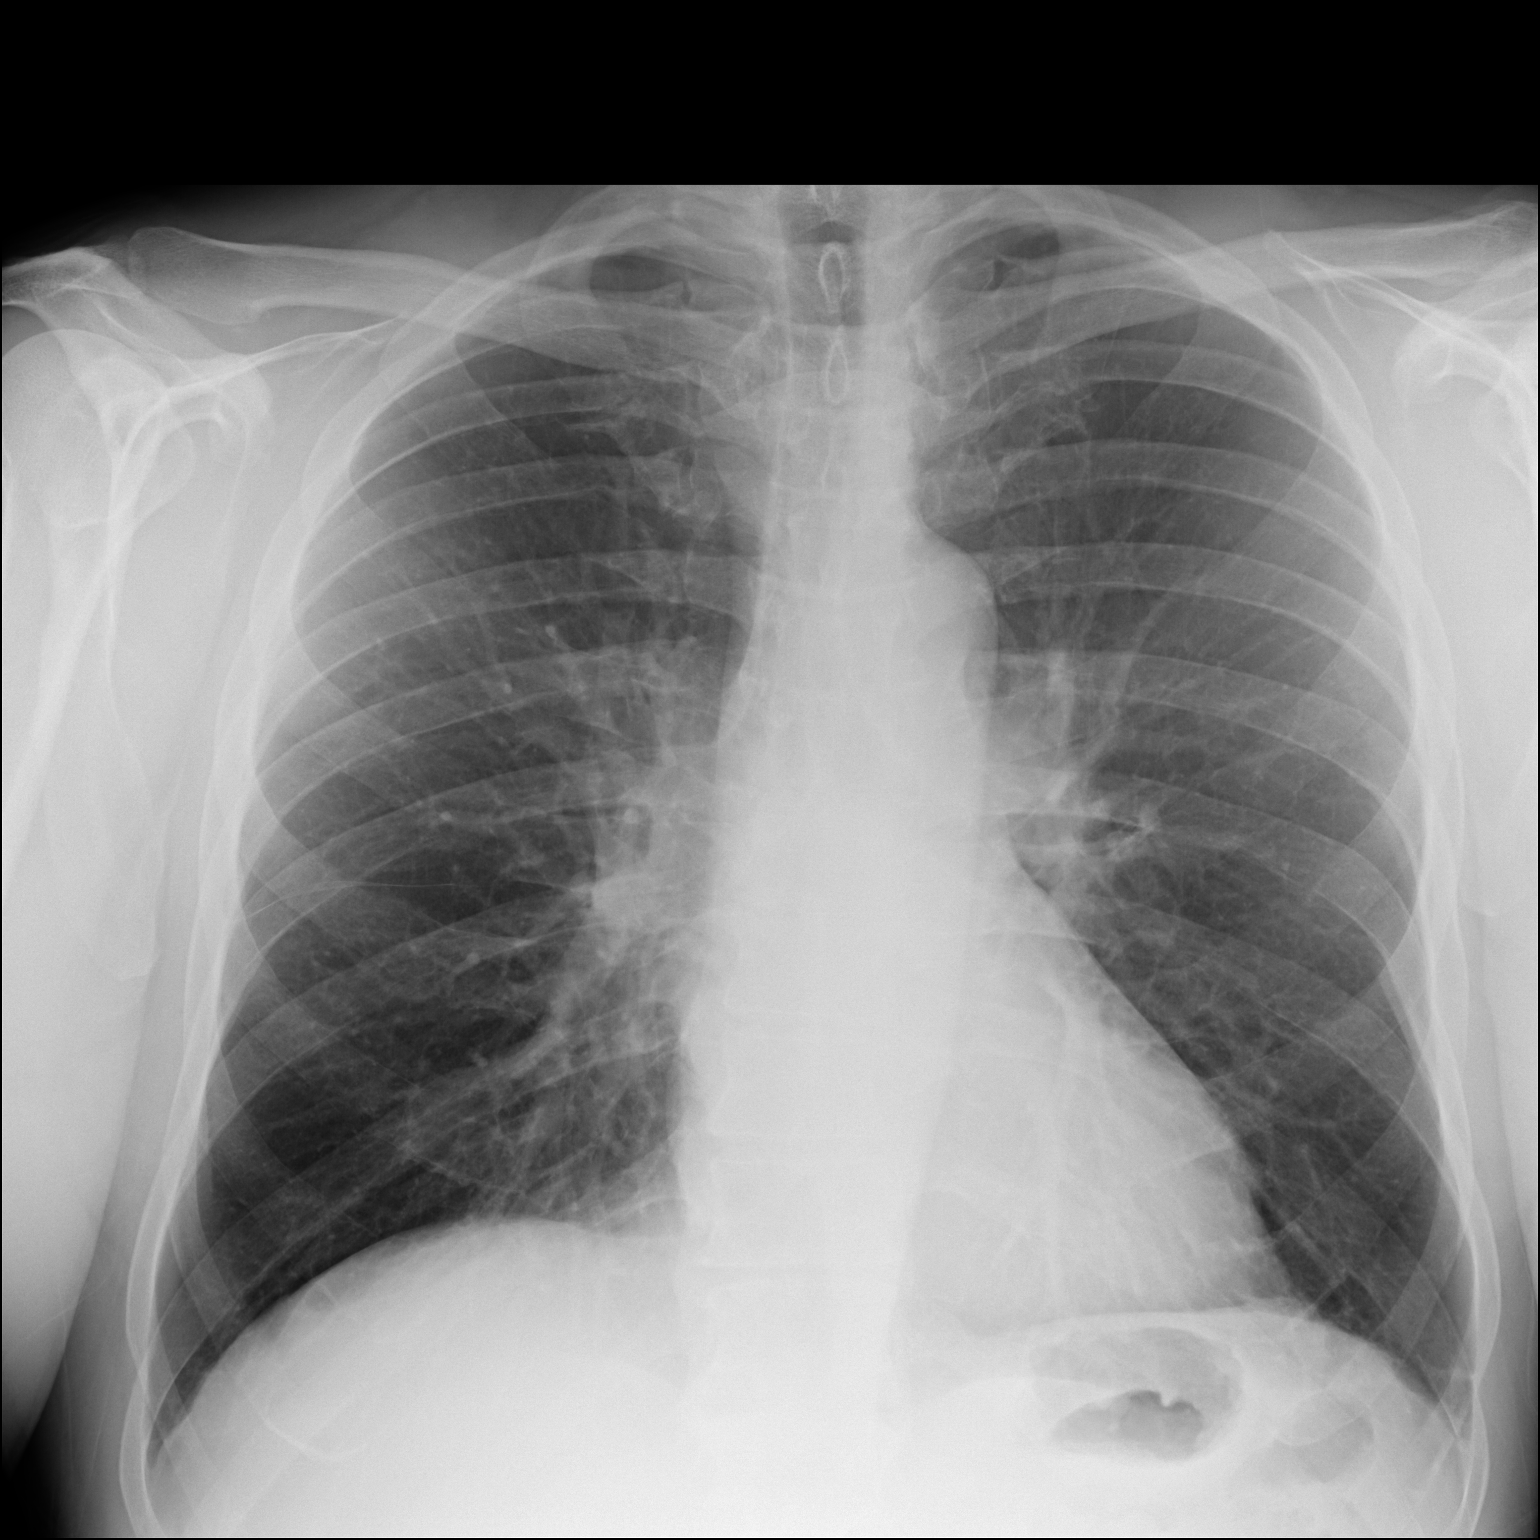

[dg chest 2 view (2 of 2)]
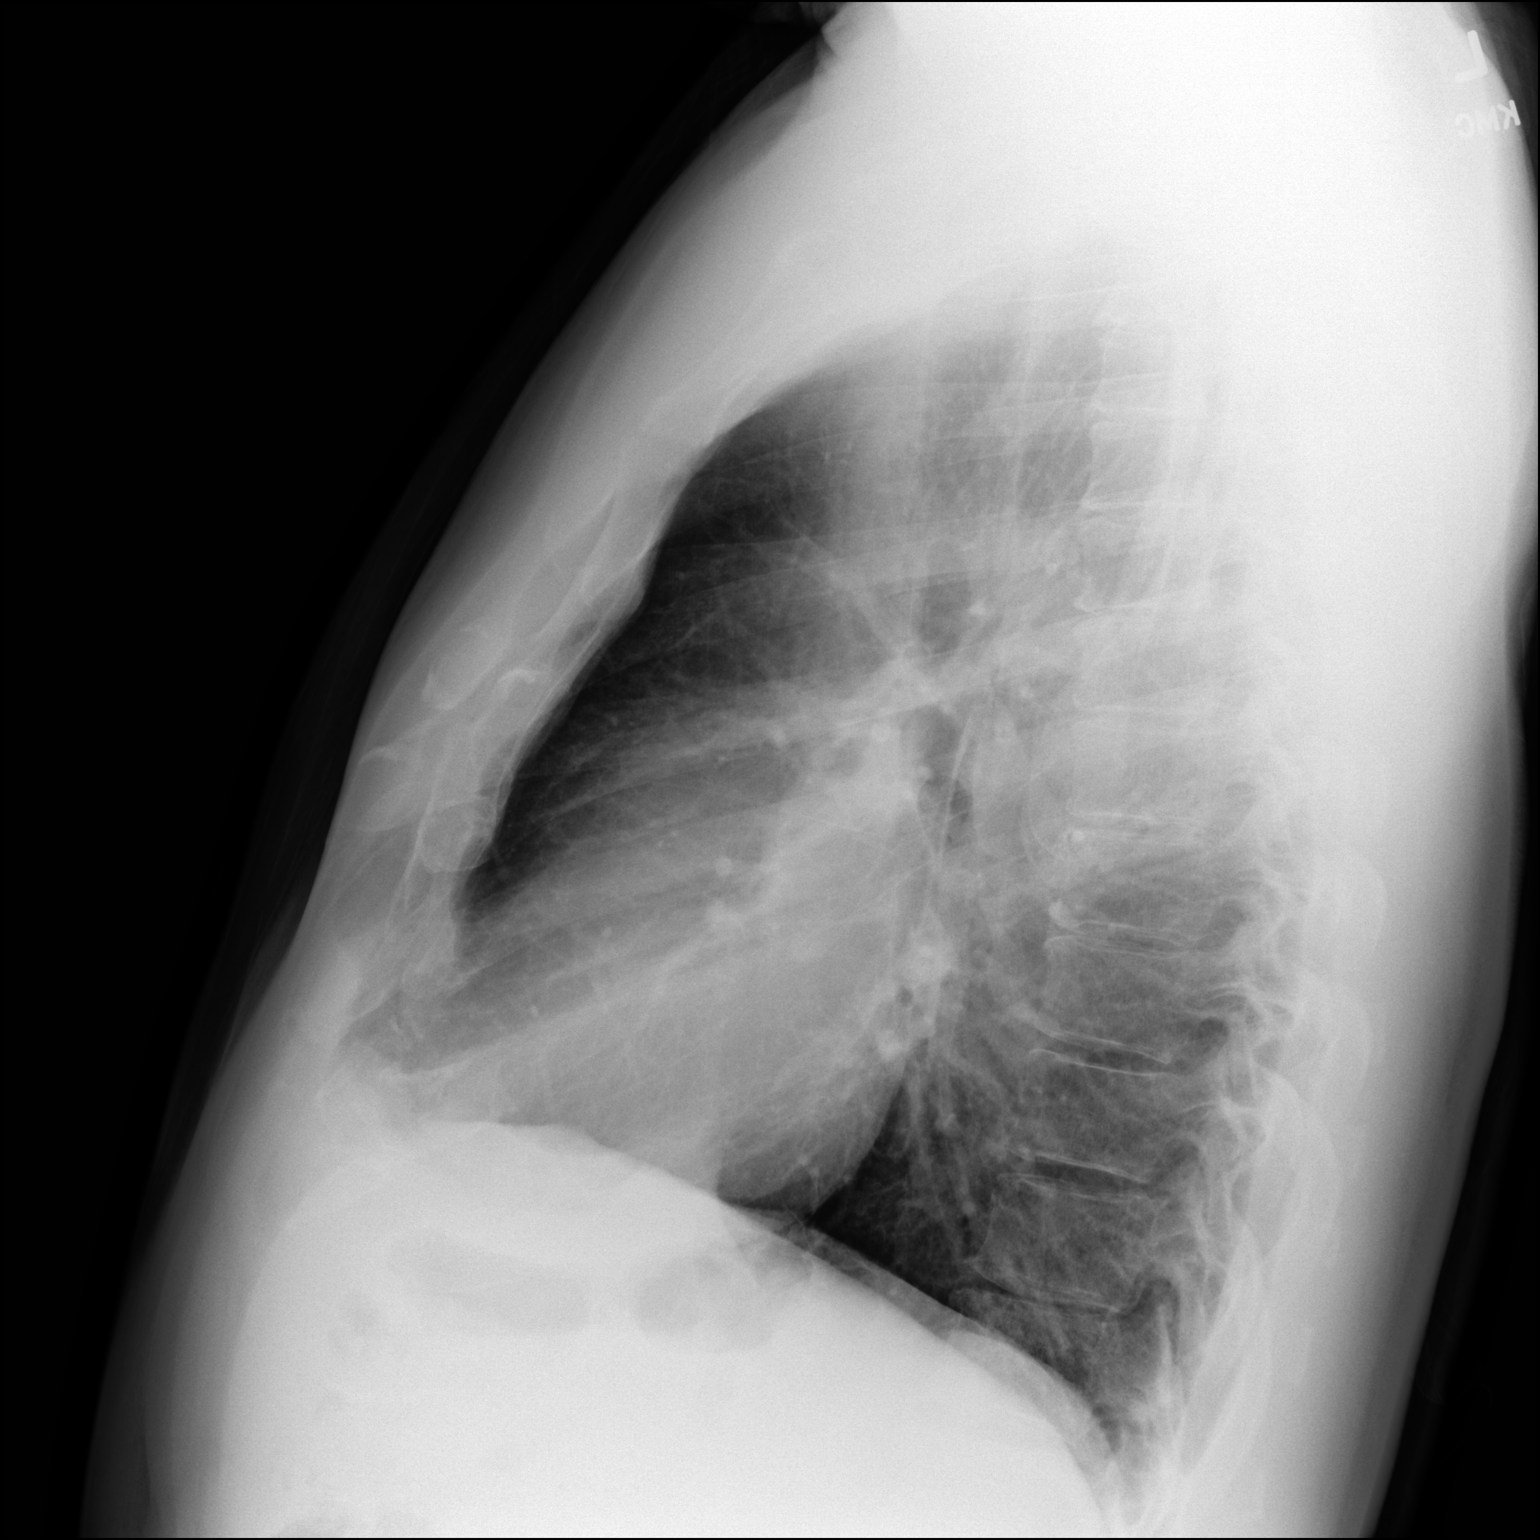

[2 of 2 positions shown; findings below may reference images not displayed]

FINDINGS: The heart size and mediastinal contours are within normal limits.
Mild diffuse bronchial wall thickening. No pleural effusion. No
pneumothorax. The visualized skeletal structures are unremarkable.
IMPRESSION: Mild diffuse bronchial wall thickening, which could be due to
bronchitis.

## 2022-04-11 ENCOUNTER — Other Ambulatory Visit: Payer: Self-pay | Admitting: Internal Medicine

## 2022-04-11 DIAGNOSIS — E785 Hyperlipidemia, unspecified: Secondary | ICD-10-CM

## 2022-04-11 DIAGNOSIS — Z0000 Encounter for general adult medical examination without abnormal findings: Secondary | ICD-10-CM | POA: Diagnosis not present

## 2022-04-11 DIAGNOSIS — Z125 Encounter for screening for malignant neoplasm of prostate: Secondary | ICD-10-CM | POA: Diagnosis not present

## 2022-04-14 DIAGNOSIS — E875 Hyperkalemia: Secondary | ICD-10-CM | POA: Diagnosis not present

## 2022-04-24 ENCOUNTER — Ambulatory Visit
Admission: RE | Admit: 2022-04-24 | Discharge: 2022-04-24 | Disposition: A | Discharge status: Home / Self Care | Payer: No Typology Code available for payment source | Source: Ambulatory Visit | Attending: Internal Medicine | Admitting: Internal Medicine

## 2022-04-24 DIAGNOSIS — E785 Hyperlipidemia, unspecified: Secondary | ICD-10-CM

## 2022-05-13 NOTE — Progress Notes (Signed)
Referring-Craig Lysle Rubens MD Reason for referral-chest pain  HPI: 56 year old male for evaluation of chest pain at request of Wenda Low MD.  Calcium score October 2023 0.  Over the past 6 to 7 months he has had occasional chest tightness and cramping in his left chest substernal area.  The pain does not radiate.  No associated symptoms.  Question increased with inspiration.  Not exertional.  Resolves spontaneously.  He does not have exertional chest pain and exercises routinely.  He denies dyspnea on exertion, orthopnea, PND or pedal edema.  He does state that his heart rate has increased with activities compared to previous.  He will be in the 165 range with vigorous activities.  Cardiology now asked to evaluate.  Note recent LDL 158.  Current Outpatient Medications  Medication Sig Dispense Refill   Multiple Vitamins-Minerals (MULTIVITAMIN WITH MINERALS) tablet Take 1 tablet by mouth daily.     NON FORMULARY Athletic Greens     rosuvastatin (CRESTOR) 20 MG tablet Take 1 tablet (20 mg total) by mouth daily. 90 tablet 3   No current facility-administered medications for this visit.    No Known Allergies   Past Medical History:  Diagnosis Date   Hyperlipidemia     Past Surgical History:  Procedure Laterality Date   HERNIA REPAIR     as an infant   INGUINAL HERNIA REPAIR Left 10/24/2020   Procedure: OPEN LEFT INGUINAL HERNIA REPAIR;  Surgeon: Clovis Riley, MD;  Location: [facility];  Service: General;  Laterality: Left;  90 MIN   TENDON REPAIR Bilateral    peroneal tendon   WISDOM TOOTH EXTRACTION      Social History   Socioeconomic History   Marital status: Married    Spouse name: Not on file   Number of children: 2   Years of education: Not on file   Highest education level: Not on file  Occupational History   Not on file  Tobacco Use   Smoking status: Never   Smokeless tobacco: Never  Vaping Use   Vaping Use: Never used  Substance and Sexual Activity    Alcohol use: Yes    Alcohol/week: 5.0 standard drinks of alcohol    Types: 5 Standard drinks or equivalent per week    Comment: 1-2 beers per night   Drug use: Never   Sexual activity: Not on file  Other Topics Concern   Not on file  Social History Narrative   Not on file   Social Determinants of Health   Financial Resource Strain: Not on file  Food Insecurity: Not on file  Transportation Needs: Not on file  Physical Activity: Not on file  Stress: Not on file  Social Connections: Not on file  Intimate Partner Violence: Not on file    Family History  Problem Relation Age of Onset   Coronary artery disease Father     ROS: no fevers or chills, productive cough, hemoptysis, dysphasia, odynophagia, melena, hematochezia, dysuria, hematuria, rash, seizure activity, orthopnea, PND, pedal edema, claudication. Remaining systems are negative.  Physical Exam:   Blood pressure 128/86, pulse 61, height '6\' 4"'$  (1.93 m), weight 227 lb (103 kg), SpO2 98 %.  General:  Well developed/well nourished in NAD Skin warm/dry Patient not depressed No peripheral clubbing Back-normal HEENT-normal/normal eyelids Neck supple/normal carotid upstroke bilaterally; no bruits; no JVD; no thyromegaly chest - CTA/ normal expansion CV - RRR/normal S1 and S2; no murmurs, rubs or gallops;  PMI nondisplaced Abdomen -NT/ND, no HSM,  no mass, + bowel sounds, no bruit 2+ femoral pulses, no bruits Ext-no edema, chords, 2+ DP Neuro-grossly nonfocal  ECG -normal sinus rhythm at a rate of 61, no significant ST changes.,  Left axis deviation personally reviewed  A/P  1 chest pain-symptoms are atypical.  Electrocardiogram shows no ST changes.  However he has a strong family history of coronary disease and also has hyperlipidemia.  I will arrange a cardiac CTA to rule out obstructive coronary disease.  We will also arrange an echocardiogram to assess LV function.  2 hyperlipidemia-recent LDL 154.  Strong family  history of coronary disease.  I have recommended Crestor 20 mg daily.  Check lipids and liver in 8 weeks.  Kirk Ruths, MD

## 2022-05-27 ENCOUNTER — Encounter: Payer: Self-pay | Admitting: Cardiology

## 2022-05-27 ENCOUNTER — Ambulatory Visit: Payer: 59 | Attending: Cardiology | Admitting: Cardiology

## 2022-05-27 VITALS — BP 128/86 | HR 61 | Ht 76.0 in | Wt 227.0 lb

## 2022-05-27 DIAGNOSIS — R072 Precordial pain: Secondary | ICD-10-CM | POA: Diagnosis not present

## 2022-05-27 DIAGNOSIS — E785 Hyperlipidemia, unspecified: Secondary | ICD-10-CM | POA: Diagnosis not present

## 2022-05-27 MED ORDER — METOPROLOL TARTRATE 50 MG PO TABS: ORAL_TABLET | ORAL | 0 refills | Status: DC

## 2022-05-27 MED ORDER — ROSUVASTATIN CALCIUM 20 MG PO TABS: 20.0000 mg | ORAL_TABLET | Freq: Every day | ORAL | 3 refills | Status: DC

## 2022-05-27 NOTE — Patient Instructions (Signed)
Medication Instructions:   START ROSUVASTATIN 20 MG ONCE DAILY  *If you need a refill on your cardiac medications before your next appointment, please call your pharmacy*   Lab Work:  Your physician recommends that you return for lab work in: 8 weeks-FASTING  If you have labs (blood work) drawn today and your tests are completely normal, you will receive your results only by: [place] (if you have MyChart) OR A paper copy in the mail If you have any lab test that is abnormal or we need to change your treatment, we will call you to review the results.   Testing/Procedures:  Your physician has requested that you have an echocardiogram. Echocardiography is a painless test that uses sound waves to create images of your heart. It provides your doctor with information about the size and shape of your heart and how well your heart's chambers and valves are working. This procedure takes approximately one hour. There are no restrictions for this procedure. Please do NOT wear cologne, perfume, aftershave, or lotions (deodorant is allowed). Please arrive 15 minutes prior to your appointment time. [place]     Your cardiac CT will be scheduled at   [facility] [place], [place] [address] [phone]    If scheduled at [facility], please arrive at the [facility] and Children's Entrance (Entrance C2) of [facility] 30 minutes prior to test start time. You can use the FREE valet parking offered at entrance C (encouraged to control the heart rate for the test)  Proceed to the [facility] Radiology Department (first floor) to check-in and test prep.  All radiology patients and guests should use entrance C2 at [facility], accessed from [facility], even though the hospital's physical address listed is [address].       Please follow these instructions carefully (unless otherwise  directed):  Hold all erectile dysfunction medications at least 3 days (72 hrs) prior to test. (Ie viagra, cialis, sildenafil, tadalafil, etc) We will administer nitroglycerin during this exam.   On the Night Before the Test: Be sure to Drink plenty of water. Do not consume any caffeinated/decaffeinated beverages or chocolate 12 hours prior to your test. Do not take any antihistamines 12 hours prior to your test.   On the Day of the Test: Drink plenty of water until 1 hour prior to the test. Do not eat any food 1 hour prior to test. You may take your regular medications prior to the test.  Take metoprolol (Lopressor) 50 MG two hours prior to test.        After the Test: Drink plenty of water. After receiving IV contrast, you may experience a mild flushed feeling. This is normal. On occasion, you may experience a mild rash up to 24 hours after the test. This is not dangerous. If this occurs, you can take Benadryl 25 mg and increase your fluid intake. If you experience trouble breathing, this can be serious. If it is severe call 911 IMMEDIATELY. If it is mild, please call our office.   We will call to schedule your test 2-4 weeks out understanding that some insurance companies will need an authorization prior to the service being performed.   For non-scheduling related questions, please contact the cardiac imaging nurse navigator should you have any questions/concerns: Marchia Bond, Cardiac Imaging Nurse Navigator Gordy Clement, Cardiac Imaging Nurse Navigator [facility] Direct Office Dial: [phone]  For scheduling needs, including cancellations and rescheduling, please call [place], [phone].    Follow-Up: At [facility], you and your health needs are our priority.  As part of our continuing mission to provide you with exceptional heart care, we have created designated Provider Care Teams.  These Care Teams include your primary  Cardiologist (physician) and Advanced Practice Providers (APPs -  Physician Assistants and Nurse Practitioners) who all work together to provide you with the care you need, when you need it.  We recommend signing up for the patient portal called "MyChart".  Sign up information is provided on this After Visit Summary.  MyChart is used to connect with patients for Virtual Visits (Telemedicine).  Patients are able to view lab/test results, encounter notes, upcoming appointments, etc.  Non-urgent messages can be sent to your provider as well.   To learn more about what you can do with MyChart, go to [email or web address].    Your next appointment:   6 month(s)  The format for your next appointment:   In Person  Provider:   Kirk Ruths MD

## 2022-06-17 ENCOUNTER — Ambulatory Visit (HOSPITAL_COMMUNITY): Payer: 59 | Attending: Cardiology

## 2022-06-17 DIAGNOSIS — I77810 Thoracic aortic ectasia: Secondary | ICD-10-CM

## 2022-06-17 DIAGNOSIS — I351 Nonrheumatic aortic (valve) insufficiency: Secondary | ICD-10-CM

## 2022-06-17 DIAGNOSIS — R072 Precordial pain: Secondary | ICD-10-CM | POA: Insufficient documentation

## 2022-06-17 DIAGNOSIS — I5030 Unspecified diastolic (congestive) heart failure: Secondary | ICD-10-CM | POA: Diagnosis not present

## 2022-06-17 LAB — ECHOCARDIOGRAM COMPLETE
Area-P 1/2: 3.74 cm2
P 1/2 time: 462 msec
S' Lateral: 3.5 cm

## 2022-06-18 ENCOUNTER — Other Ambulatory Visit: Payer: Self-pay | Admitting: *Deleted

## 2022-06-18 DIAGNOSIS — I77810 Thoracic aortic ectasia: Secondary | ICD-10-CM

## 2022-07-07 ENCOUNTER — Telehealth (HOSPITAL_COMMUNITY): Payer: Self-pay | Admitting: Emergency Medicine

## 2022-07-07 NOTE — Telephone Encounter (Signed)
Reaching out to patient to offer assistance regarding upcoming cardiac imaging study; pt verbalizes understanding of appt date/time, parking situation and where to check in, pre-test NPO status and medications ordered, and verified current allergies; name and call back number provided for further questions should they arise Marchia Bond RN Navigator Cardiac Imaging [facility] [phone] office [phone] cell   Arrival 800 WC entrance '50mg'$  metoprolol tartrate Denies iv issues Aware contrast/nitro CCTA + CTA aorta

## 2022-07-08 ENCOUNTER — Ambulatory Visit (HOSPITAL_COMMUNITY)
Admission: RE | Admit: 2022-07-08 | Discharge: 2022-07-08 | Disposition: A | Discharge status: Home / Self Care | Payer: 59 | Source: Ambulatory Visit | Attending: Cardiology | Admitting: Cardiology

## 2022-07-08 DIAGNOSIS — I77810 Thoracic aortic ectasia: Secondary | ICD-10-CM | POA: Insufficient documentation

## 2022-07-08 DIAGNOSIS — Z0389 Encounter for observation for other suspected diseases and conditions ruled out: Secondary | ICD-10-CM | POA: Diagnosis not present

## 2022-07-08 DIAGNOSIS — R072 Precordial pain: Secondary | ICD-10-CM

## 2022-07-08 MED ORDER — IOHEXOL 350 MG/ML SOLN
100.0000 mL | Freq: Once | INTRAVENOUS | Status: AC | PRN
  Administered 2022-07-08: 100 mL via INTRAVENOUS

## 2022-07-08 MED ORDER — NITROGLYCERIN 0.4 MG SL SUBL
0.8000 mg | SUBLINGUAL_TABLET | Freq: Once | SUBLINGUAL | Status: AC
  Administered 2022-07-08: 0.8 mg via SUBLINGUAL

## 2022-07-08 MED ORDER — NITROGLYCERIN 0.4 MG SL SUBL
SUBLINGUAL_TABLET | SUBLINGUAL | Status: AC
  Filled 2022-07-08: qty 2

## 2022-07-09 DIAGNOSIS — D0439 Carcinoma in situ of skin of other parts of face: Secondary | ICD-10-CM | POA: Diagnosis not present

## 2022-07-09 DIAGNOSIS — D234 Other benign neoplasm of skin of scalp and neck: Secondary | ICD-10-CM | POA: Diagnosis not present

## 2022-07-24 ENCOUNTER — Other Ambulatory Visit: Payer: 59

## 2022-08-14 ENCOUNTER — Encounter: Payer: Self-pay | Admitting: *Deleted

## 2022-08-26 DIAGNOSIS — E785 Hyperlipidemia, unspecified: Secondary | ICD-10-CM | POA: Diagnosis not present

## 2022-08-27 LAB — HEPATIC FUNCTION PANEL
ALT: 49 IU/L — ABNORMAL HIGH (ref 0–44)
AST: 32 IU/L (ref 0–40)
Albumin: 4.7 g/dL (ref 3.8–4.9)
Alkaline Phosphatase: 68 IU/L (ref 44–121)
Bilirubin Total: 0.8 mg/dL (ref 0.0–1.2)
Bilirubin, Direct: 0.33 mg/dL (ref 0.00–0.40)
Total Protein: 7.7 g/dL (ref 6.0–8.5)

## 2022-08-27 LAB — LIPID PANEL
Chol/HDL Ratio: 2.5 ratio (ref 0.0–5.0)
Cholesterol, Total: 221 mg/dL — ABNORMAL HIGH (ref 100–199)
HDL: 88 mg/dL (ref >39)
LDL Chol Calc (NIH): 120 mg/dL — ABNORMAL HIGH (ref 0–99)
Triglycerides: 75 mg/dL (ref 0–149)
VLDL Cholesterol Cal: 13 mg/dL (ref 5–40)

## 2022-08-29 ENCOUNTER — Telehealth: Payer: Self-pay | Admitting: *Deleted

## 2022-08-29 DIAGNOSIS — E785 Hyperlipidemia, unspecified: Secondary | ICD-10-CM

## 2022-08-29 MED ORDER — EZETIMIBE 10 MG PO TABS: 10.0000 mg | ORAL_TABLET | Freq: Every day | ORAL | 3 refills | Status: DC

## 2022-08-29 NOTE — Telephone Encounter (Signed)
-----   Message from Lelon Perla, MD sent at 08/27/2022  2:48 PM EST ----- Add zetia 10 mg daily; lipids and liver 8 weeks Kirk Ruths

## 2022-10-09 ENCOUNTER — Encounter: Payer: Self-pay | Admitting: Cardiology

## 2022-10-16 DIAGNOSIS — R059 Cough, unspecified: Secondary | ICD-10-CM | POA: Diagnosis not present

## 2022-10-16 DIAGNOSIS — R040 Epistaxis: Secondary | ICD-10-CM | POA: Diagnosis not present

## 2022-11-13 ENCOUNTER — Ambulatory Visit: Payer: 59 | Admitting: Cardiology

## 2022-12-02 NOTE — Progress Notes (Unsigned)
Cardiology Office Note:    Date:  12/04/2022   ID:  Craig Burnett, DOB Jul 03, 1965, MRN [id number]  PCP:  Georgann Housekeeper, MD [facility] Cardiologist: Olga Millers, MD   Reason for visit: 56-month follow-up  History of Present Illness:    Craig Burnett is a 57 y.o. male with a hx of calcium score of 0 in 2023, hyperlipidemia.  He was seen for chest pain which was thought atypical.  Cardiac CTA was ordered and showed normal coronary arteries.  2D echo showed EF 55 to 60%, grade 1 diastolic dysfunction, no significant valve disease, moderately dilated aortic root at 48 mm.  Therefore CT angio chest aorta was ordered and showed aortic root dilation at 4.7 cm.  No definite aneurysm dilation in other portions of the thoracic aorta.  Recommend repeat CTA in 6 months.  Patient notes his father had a CABG at age 5.  His mother had a AAA that required surgery.  With LDL 154 and strong family history of CAD, he was started on Crestor 20 mg daily.  LDL improved to 120 and February 2024.  Zetia added.  Today, patient recounts his history with me.  He states about 2 years ago he started having intermittent chest tightness and cough.  He has been a Corporate investment banker since age 48.  Lung workup was unremarkable per his report.  He states his chest tightness episodes have improved over time.  He will notice intermittent slight chest tightness that he describes as a cramping down sensation.  He does not notice any chest pain when he is working out.  He exercises daily.  He notices chest discomfort more when he is sitting/not distracted.  He denies shortness of breath.  He denies palpitations, PND, orthopnea, leg swelling, dizziness and syncope.  He states his resting heart rate is typically 60s to 70s.    Past Medical History:  Diagnosis Date   Hyperlipidemia     Past Surgical History:  Procedure Laterality Date   HERNIA REPAIR     as an infant   INGUINAL HERNIA REPAIR Left 10/24/2020    Procedure: OPEN LEFT INGUINAL HERNIA REPAIR;  Surgeon: Berna Bue, MD;  Location: [facility];  Service: General;  Laterality: Left;  90 MIN   TENDON REPAIR Bilateral    peroneal tendon   WISDOM TOOTH EXTRACTION      Current Medications: Current Meds  Medication Sig   ezetimibe (ZETIA) 10 MG tablet Take 1 tablet (10 mg total) by mouth daily.   metoprolol tartrate (LOPRESSOR) 50 MG tablet TAKE 2 HOURS PRIOR TO CT SCAN   Multiple Vitamins-Minerals (MULTIVITAMIN WITH MINERALS) tablet Take 1 tablet by mouth daily.   NON FORMULARY Athletic Greens   rosuvastatin (CRESTOR) 20 MG tablet Take 1 tablet (20 mg total) by mouth daily.     Allergies:   Patient has no known allergies.   Social History   Socioeconomic History   Marital status: Married    Spouse name: Not on file   Number of children: 2   Years of education: Not on file   Highest education level: Not on file  Occupational History   Not on file  Tobacco Use   Smoking status: Never   Smokeless tobacco: Never  Vaping Use   Vaping Use: Never used  Substance and Sexual Activity   Alcohol use: Yes    Alcohol/week: 5.0 standard drinks of alcohol    Types: 5 Standard drinks or equivalent per week  Comment: 1-2 beers per night   Drug use: Never   Sexual activity: Not on file  Other Topics Concern   Not on file  Social History Narrative   Not on file   Social Determinants of Health   Financial Resource Strain: Not on file  Food Insecurity: Not on file  Transportation Needs: Not on file  Physical Activity: Not on file  Stress: Not on file  Social Connections: Not on file     Family History: The patient's family history includes Coronary artery disease in his father.  ROS:   Please see the history of present illness.     EKGs/Labs/Other Studies Reviewed:    EKG:  The ekg ordered today demonstrates normal sinus rhythm, heart rate 75, left axis deviation.  Recent Labs: 08/26/2022: ALT 49   Recent Lipid  Panel Lab Results  Component Value Date/Time   CHOL 221 (H) 08/26/2022 08:19 AM   TRIG 75 08/26/2022 08:19 AM   HDL 88 08/26/2022 08:19 AM   LDLCALC 120 (H) 08/26/2022 08:19 AM    Physical Exam:    VS:  BP 120/82   Pulse 75   Ht 6\' 4"  (1.93 m)   Wt 218 lb (98.9 kg)   SpO2 97%   BMI 26.54 kg/m    No data found.   Wt Readings from Last 3 Encounters:  12/04/22 218 lb (98.9 kg)  05/27/22 227 lb (103 kg)  10/24/20 224 lb (101.6 kg)     GEN:  Well nourished, well developed in no acute distress HEENT: Normal NECK: No JVD; No carotid bruits CARDIAC: RRR, no murmurs, rubs, gallops RESPIRATORY:  Clear to auscultation without rales, wheezing or rhonchi  ABDOMEN: Soft, non-tender, non-distended MUSCULOSKELETAL: No edema SKIN: Warm and dry NEUROLOGIC:  Alert and oriented PSYCHIATRIC:  Normal affect     ASSESSMENT AND PLAN   Dilated aortic root -Echo 05/2022: dilated aortic root at 48 mm.   -CT angio chest aorta 06/2022: aortic root dilation at 4.7 cm.  No definite aneurysm dilation in other portions of the thoracic aorta.  Recommend repeat CTA in 6 months -will schedule for July. -Goal systolic blood pressure 105-120.  Not on medication at this time.  BP currently at goal.  If systolic blood pressure consistently over 120 in future, recommend starting low-dose beta-blocker therapy.  Hyperlipidemia -With LDL 154 and strong family history of CAD, he was started on Crestor 20 mg daily.  LDL improved to 120 and February 2024.  Zetia added. -Check fasting lipids today. -Discussed cholesterol lowering diets - Mediterranean diet, DASH diet, vegetarian diet, low-carbohydrate diet and avoidance of trans fats.  Discussed healthier choice substitutes.  Nuts, high-fiber foods, and fiber supplements may also improve lipids.    Disposition - Follow-up in 6 months.    Signed, Cannon Kettle, PA-C  12/04/2022 10:18 AM    [facility]

## 2022-12-03 ENCOUNTER — Ambulatory Visit: Payer: 59 | Admitting: Physician Assistant

## 2022-12-04 ENCOUNTER — Ambulatory Visit: Payer: 59 | Attending: Cardiology | Admitting: Physician Assistant

## 2022-12-04 ENCOUNTER — Encounter: Payer: Self-pay | Admitting: Physician Assistant

## 2022-12-04 VITALS — BP 120/82 | HR 75 | Ht 76.0 in | Wt 218.0 lb

## 2022-12-04 DIAGNOSIS — E785 Hyperlipidemia, unspecified: Secondary | ICD-10-CM

## 2022-12-04 DIAGNOSIS — R072 Precordial pain: Secondary | ICD-10-CM

## 2022-12-04 DIAGNOSIS — I77810 Thoracic aortic ectasia: Secondary | ICD-10-CM | POA: Diagnosis not present

## 2022-12-04 LAB — BASIC METABOLIC PANEL
BUN/Creatinine Ratio: 13 (ref 9–20)
BUN: 14 mg/dL (ref 6–24)
CO2: 26 mmol/L (ref 20–29)
Calcium: 10 mg/dL (ref 8.7–10.2)
Chloride: 100 mmol/L (ref 96–106)
Creatinine, Ser: 1.12 mg/dL (ref 0.76–1.27)
Glucose: 82 mg/dL (ref 70–99)
Potassium: 5.2 mmol/L (ref 3.5–5.2)
Sodium: 138 mmol/L (ref 134–144)
eGFR: 76 mL/min/{1.73_m2} (ref >59)

## 2022-12-04 LAB — LIPID PANEL
Chol/HDL Ratio: 1.9 ratio (ref 0.0–5.0)
Cholesterol, Total: 187 mg/dL (ref 100–199)
HDL: 98 mg/dL (ref >39)
LDL Chol Calc (NIH): 79 mg/dL (ref 0–99)
Triglycerides: 48 mg/dL (ref 0–149)
VLDL Cholesterol Cal: 10 mg/dL (ref 5–40)

## 2022-12-04 NOTE — Addendum Note (Signed)
Addended by: Myna Hidalgo A on: 12/04/2022 10:31 AM   Modules accepted: Orders

## 2022-12-04 NOTE — Patient Instructions (Signed)
Medication Instructions:  No Changes *If you need a refill on your cardiac medications before your next appointment, please call your pharmacy*   Lab Work: BMET, Lipid Panel. Today If you have labs (blood work) drawn today and your tests are completely normal, you will receive your results only by: MyChart Message (if you have MyChart) OR A paper copy in the mail If you have any lab test that is abnormal or we need to change your treatment, we will call you to review the results.   Testing/Procedures: [facility]. Your physician has requested that you have cardiac CT. Cardiac computed tomography (CT) is a painless test that uses an x-ray machine to take clear, detailed pictures of your heart. For further information please visit [email or web address] Please follow instruction sheet as given.     Follow-Up: At [facility], you and your health needs are our priority.  As part of our continuing mission to provide you with exceptional heart care, we have created designated Provider Care Teams.  These Care Teams include your primary Cardiologist (physician) and Advanced Practice Providers (APPs -  Physician Assistants and Nurse Practitioners) who all work together to provide you with the care you need, when you need it.  We recommend signing up for the patient portal called "MyChart".  Sign up information is provided on this After Visit Summary.  MyChart is used to connect with patients for Virtual Visits (Telemedicine).  Patients are able to view lab/test results, encounter notes, upcoming appointments, etc.  Non-urgent messages can be sent to your provider as well.   To learn more about what you can do with MyChart, go to [email or web address].    Your next appointment:   6 month(s)  Provider:   Olga Millers, MD

## 2022-12-10 ENCOUNTER — Telehealth: Payer: Self-pay

## 2022-12-10 NOTE — Telephone Encounter (Addendum)
Called patient regarding results. Patient had understanding of results.----- Message from Cannon Kettle, PA-C sent at 12/08/2022  1:51 PM EDT ----- Normal kidney function.  Normal electrolytes.  After Zetia was added, your LDL has improved from 120 to 79.  This is great progress!  HDL (good cholesterol) is very high and triglycerides at goal. -Recommend continue with current therapy.

## 2022-12-23 DIAGNOSIS — R718 Other abnormality of red blood cells: Secondary | ICD-10-CM | POA: Diagnosis not present

## 2022-12-23 DIAGNOSIS — M2550 Pain in unspecified joint: Secondary | ICD-10-CM | POA: Diagnosis not present

## 2022-12-23 DIAGNOSIS — R509 Fever, unspecified: Secondary | ICD-10-CM | POA: Diagnosis not present

## 2022-12-23 DIAGNOSIS — R21 Rash and other nonspecific skin eruption: Secondary | ICD-10-CM | POA: Diagnosis not present

## 2022-12-23 DIAGNOSIS — T07XXXA Unspecified multiple injuries, initial encounter: Secondary | ICD-10-CM | POA: Diagnosis not present

## 2023-01-05 ENCOUNTER — Ambulatory Visit (HOSPITAL_COMMUNITY)
Admission: RE | Admit: 2023-01-05 | Discharge: 2023-01-05 | Disposition: A | Discharge status: Home / Self Care | Payer: 59 | Source: Ambulatory Visit | Attending: Physician Assistant | Admitting: Physician Assistant

## 2023-01-05 DIAGNOSIS — E785 Hyperlipidemia, unspecified: Secondary | ICD-10-CM | POA: Insufficient documentation

## 2023-01-05 DIAGNOSIS — R072 Precordial pain: Secondary | ICD-10-CM | POA: Diagnosis not present

## 2023-01-05 DIAGNOSIS — I77810 Thoracic aortic ectasia: Secondary | ICD-10-CM | POA: Diagnosis not present

## 2023-01-05 DIAGNOSIS — I517 Cardiomegaly: Secondary | ICD-10-CM | POA: Diagnosis not present

## 2023-01-05 MED ORDER — IOHEXOL 350 MG/ML SOLN
75.0000 mL | Freq: Once | INTRAVENOUS | Status: AC | PRN
  Administered 2023-01-05: 75 mL via INTRAVENOUS

## 2023-01-07 ENCOUNTER — Telehealth: Payer: Self-pay

## 2023-01-07 NOTE — Telephone Encounter (Addendum)
Results seen by patient via Mychart----- Message from Cannon Kettle, PA-C sent at 01/06/2023  2:05 PM EDT ----- Stable dilatation of the aortic root measuring between 4.4-4.8 cm depending on plane of measurement.   -Goal systolic blood pressure 105-120.  Not on medication at this time.  BP currently at goal.  If systolic blood pressure consistently over 120 in future, recommend starting low-dose beta-blocker therapy. -Repeat CT angio chest aorta in 1 year to monitor.

## 2023-01-12 DIAGNOSIS — R799 Abnormal finding of blood chemistry, unspecified: Secondary | ICD-10-CM | POA: Diagnosis not present

## 2023-03-19 ENCOUNTER — Emergency Department (HOSPITAL_COMMUNITY): Payer: 59

## 2023-03-19 ENCOUNTER — Emergency Department (HOSPITAL_COMMUNITY)
Admission: EM | Admit: 2023-03-19 | Discharge: 2023-03-20 | Disposition: A | Discharge status: Home / Self Care | Payer: 59 | Attending: Emergency Medicine | Admitting: Emergency Medicine

## 2023-03-19 DIAGNOSIS — I719 Aortic aneurysm of unspecified site, without rupture: Secondary | ICD-10-CM | POA: Diagnosis not present

## 2023-03-19 DIAGNOSIS — K5730 Diverticulosis of large intestine without perforation or abscess without bleeding: Secondary | ICD-10-CM | POA: Diagnosis not present

## 2023-03-19 DIAGNOSIS — K5900 Constipation, unspecified: Secondary | ICD-10-CM | POA: Diagnosis not present

## 2023-03-19 DIAGNOSIS — I309 Acute pericarditis, unspecified: Secondary | ICD-10-CM

## 2023-03-19 DIAGNOSIS — R079 Chest pain, unspecified: Secondary | ICD-10-CM

## 2023-03-19 DIAGNOSIS — R0789 Other chest pain: Secondary | ICD-10-CM | POA: Diagnosis not present

## 2023-03-19 DIAGNOSIS — I319 Disease of pericardium, unspecified: Secondary | ICD-10-CM | POA: Diagnosis not present

## 2023-03-19 DIAGNOSIS — K529 Noninfective gastroenteritis and colitis, unspecified: Secondary | ICD-10-CM | POA: Diagnosis not present

## 2023-03-19 LAB — CBC WITH DIFFERENTIAL/PLATELET
Abs Immature Granulocytes: 0.03 10*3/uL (ref 0.00–0.07)
Basophils Absolute: 0.1 10*3/uL (ref 0.0–0.1)
Basophils Relative: 1 %
Eosinophils Absolute: 0.1 10*3/uL (ref 0.0–0.5)
Eosinophils Relative: 1 %
HCT: 43.7 % (ref 39.0–52.0)
Hemoglobin: 14.9 g/dL (ref 13.0–17.0)
Immature Granulocytes: 0 %
Lymphocytes Relative: 17 %
Lymphs Abs: 1.5 10*3/uL (ref 0.7–4.0)
MCH: 32.7 pg (ref 26.0–34.0)
MCHC: 34.1 g/dL (ref 30.0–36.0)
MCV: 95.8 fL (ref 80.0–100.0)
Monocytes Absolute: 1.2 10*3/uL — ABNORMAL HIGH (ref 0.1–1.0)
Monocytes Relative: 13 %
Neutro Abs: 6.2 10*3/uL (ref 1.7–7.7)
Neutrophils Relative %: 68 %
Platelets: 229 10*3/uL (ref 150–400)
RBC: 4.56 MIL/uL (ref 4.22–5.81)
RDW: 13 % (ref 11.5–15.5)
WBC: 9.1 10*3/uL (ref 4.0–10.5)
nRBC: 0 % (ref 0.0–0.2)

## 2023-03-19 LAB — BASIC METABOLIC PANEL
Anion gap: 10 (ref 5–15)
BUN: 16 mg/dL (ref 6–20)
CO2: 27 mmol/L (ref 22–32)
Calcium: 9.1 mg/dL (ref 8.9–10.3)
Chloride: 99 mmol/L (ref 98–111)
Creatinine, Ser: 0.78 mg/dL (ref 0.61–1.24)
GFR, Estimated: >60 mL/min (ref >60)
Glucose, Bld: 89 mg/dL (ref 70–99)
Potassium: 3.9 mmol/L (ref 3.5–5.1)
Sodium: 136 mmol/L (ref 135–145)

## 2023-03-19 LAB — TROPONIN I (HIGH SENSITIVITY): Troponin I (High Sensitivity): 5 ng/L (ref <18)

## 2023-03-19 MED ORDER — IOHEXOL 350 MG/ML SOLN
100.0000 mL | Freq: Once | INTRAVENOUS | Status: AC | PRN
  Administered 2023-03-19: 100 mL via INTRAVENOUS

## 2023-03-19 NOTE — ED Provider Triage Note (Signed)
Emergency Medicine Provider Triage Evaluation Note  Craig Burnett , a 57 y.o. male  was evaluated in triage.  Pt complains of chest pain since 0615 today.  He states it woke him from sleep.  He reports he now has pain radiating into his left arm and feels numbness in his left toes.  Denies shortness of breath.  History of dilated aortic root and hyperlipidemia.   Review of Systems  Positive: As above Negative: As above  Physical Exam  There were no vitals taken for this visit. Gen:   Awake, no distress   Resp:  Normal effort  MSK:   Moves extremities without difficulty  Other:    Medical Decision Making  Medically screening exam initiated at 3:05 PM.  Appropriate orders placed.  JATARIUS HARRIEL was informed that the remainder of the evaluation will be completed by another provider, this initial triage assessment does not replace that evaluation, and the importance of remaining in the ED until their evaluation is complete.     Melton Alar R, PA-C 03/19/23 [phone]

## 2023-03-19 NOTE — Discharge Instructions (Signed)
We evaluated you for your chest pain.  Your CT scan did not show any sign of any dangerous problem with your aorta or any sign of a blood clot.  Your symptoms may be due to pericarditis.  This is inflammation of the outer lining of your heart.  Please take 600 mg of Motrin every 6 hours.  Please follow-up closely with cardiology.  We have placed a cardiology referral to help with this.  Please return to the emergency department if you have any new or worsening symptoms such as severe pain, lightheadedness or dizziness, fainting, difficulty breathing, or any other new symptoms.

## 2023-03-19 NOTE — ED Triage Notes (Signed)
Patient here from home reporting central chest pain radiating down into left arm that started at 615a this morning while sleeping.

## 2023-03-19 NOTE — ED Provider Notes (Signed)
[place] EMERGENCY DEPARTMENT AT [facility] Provider Note  CSN: [id number] Arrival date & time: 03/19/23 1453  Chief Complaint(s) Chest Pain  HPI Craig Burnett is a 57 y.o. male history of hyperlipidemia, enlarged aortic root presented to the emergency department with chest pain.  Reports it happened suddenly, reports pleuritic.  It is worse with lying flat improved with leaning forward.  No recent travel or surgeries.  No fevers or chills.  No shortness of breath.  No lightheadedness or dizziness.  No syncope.  He reports his symptoms have improved significantly since they began.  Reports today started at around 6 AM.   Past Medical History Past Medical History:  Diagnosis Date   Hyperlipidemia    There are no problems to display for this patient.  Home Medication(s) Prior to Admission medications   Medication Sig Start Date End Date Taking? Authorizing Provider  ADVIL 200 MG CAPS Take 200-400 mg by mouth every 8 (eight) hours as needed (for pain or discomfort).   Yes [provider]  EXCEDRIN EXTRA STRENGTH [phone] MG tablet Take 1 tablet by mouth every 6 (six) hours as needed for headache (or pain).   Yes [provider]  ezetimibe (ZETIA) 10 MG tablet Take 1 tablet (10 mg total) by mouth daily. 08/29/22  Yes Lewayne Bunting, MD  Misc Natural Products (SUPER GREENS PO) Take 4-8 oz by mouth See admin instructions. Athletic Greens drink/shake/smoothie- Drink 4-8 ounces by mouth in the morning   Yes [provider]  Multiple Vitamins-Minerals (MULTIVITAMIN WITH MINERALS) tablet Take 1 tablet by mouth at bedtime.   Yes [provider]  rosuvastatin (CRESTOR) 20 MG tablet Take 1 tablet (20 mg total) by mouth daily. 05/27/22  Yes Lewayne Bunting, MD  metoprolol tartrate (LOPRESSOR) 50 MG tablet TAKE 2 HOURS PRIOR TO CT SCAN Patient not taking: Reported on 03/19/2023 05/27/22   Lewayne Bunting, MD                                                                                                                                     Past Surgical History Past Surgical History:  Procedure Laterality Date   HERNIA REPAIR     as an infant   INGUINAL HERNIA REPAIR Left 10/24/2020   Procedure: OPEN LEFT INGUINAL HERNIA REPAIR;  Surgeon: Berna Bue, MD;  Location: [facility];  Service: General;  Laterality: Left;  90 MIN   TENDON REPAIR Bilateral    peroneal tendon   WISDOM TOOTH EXTRACTION     Family History Family History  Problem Relation Age of Onset   Coronary artery disease Father     Social History Social History   Tobacco Use   Smoking status: Never   Smokeless tobacco: Never  Vaping Use   Vaping status: Never Used  Substance Use Topics   Alcohol use: Yes    Alcohol/week: 5.0 standard drinks of  alcohol    Types: 5 Standard drinks or equivalent per week    Comment: 1-2 beers per night   Drug use: Never   Allergies Patient has no known allergies.  Review of Systems Review of Systems  All other systems reviewed and are negative.   Physical Exam Vital Signs  I have reviewed the triage vital signs BP 137/75 (BP Location: Right Arm)   Pulse 61   Temp 98.1 F (36.7 C) (Oral)   Resp 14   SpO2 98%  Physical Exam Vitals and nursing note reviewed.  Constitutional:      General: He is not in acute distress.    Appearance: Normal appearance.  HENT:     Mouth/Throat:     Mouth: Mucous membranes are moist.  Eyes:     Conjunctiva/sclera: Conjunctivae normal.  Cardiovascular:     Rate and Rhythm: Normal rate and regular rhythm.  Pulmonary:     Effort: Pulmonary effort is normal. No respiratory distress.     Breath sounds: Normal breath sounds.  Abdominal:     General: Abdomen is flat.     Palpations: Abdomen is soft.     Tenderness: There is no abdominal tenderness.  Musculoskeletal:     Right lower leg: No edema.     Left lower leg: No edema.  Skin:    General: Skin is warm and dry.      Capillary Refill: Capillary refill takes less than 2 seconds.  Neurological:     Mental Status: He is alert and oriented to person, place, and time. Mental status is at baseline.  Psychiatric:        Mood and Affect: Mood normal.        Behavior: Behavior normal.     ED Results and Treatments Labs (all labs ordered are listed, but only abnormal results are displayed) Labs Reviewed  CBC WITH DIFFERENTIAL/PLATELET - Abnormal; Notable for the following components:      Result Value   Monocytes Absolute 1.2 (*)    All other components within normal limits  BASIC METABOLIC PANEL  TROPONIN I (HIGH SENSITIVITY)  TROPONIN I (HIGH SENSITIVITY)                                                                                                                          Radiology DG Chest 2 View  Result Date: 03/19/2023 CLINICAL DATA:  Chest pain EXAM: CHEST - 2 VIEW COMPARISON:  Chest x-ray 07/20/2020 FINDINGS: The heart size and mediastinal contours are within normal limits. Both lungs are clear. The visualized skeletal structures are unremarkable. IMPRESSION: No active cardiopulmonary disease. Electronically Signed   By: Darliss Cheney M.D.   On: 03/19/2023 17:13    Pertinent labs & imaging results that were available during my care of the patient were reviewed by me and considered in my medical decision making (see MDM for details).  Medications Ordered in ED Medications  iohexol (OMNIPAQUE) 350 MG/ML injection 100 mL (100 mLs  Intravenous Contrast Given 03/19/23 2251)                                                                                                                                     Procedures Procedures  (including critical care time)  Medical Decision Making / ED Course   MDM:  57 year old presenting to the emergency department with chest pain.  Patient well-appearing, physical exam reassuring.  Vitals are reassuring.  Suspect symptoms could be due to pericarditis.   His EKG does have some PR depressions and his symptoms are worse with lying flat and improved with leaning forward.  Given history of aortic root enlargement, differential also includes dissection, but lower concern, given some possibility this will obtain CT angiography of the chest.  On my interpretation does not show any dissection or large PE.  Lower concern for ACS, troponin negative, pending delta troponin.  CT angiography and repeat troponin pending. Signed out to, provider Dr. Bebe Shaggy pending CT scan and repeat troponin.      Additional history obtained:  -External records from outside source obtained and reviewed including: Chart review including previous notes, labs, imaging, consultation notes including prior cardiology notes    Lab Tests: -I ordered, reviewed, and interpreted labs.   The pertinent results include:   Labs Reviewed  CBC WITH DIFFERENTIAL/PLATELET - Abnormal; Notable for the following components:      Result Value   Monocytes Absolute 1.2 (*)    All other components within normal limits  BASIC METABOLIC PANEL  TROPONIN I (HIGH SENSITIVITY)  TROPONIN I (HIGH SENSITIVITY)    Notable for normal initial troponin  EKG   EKG Interpretation Date/Time:  Thursday March 19 2023 15:05:32 EDT Ventricular Rate:  69 PR Interval:  184 QRS Duration:  90 QT Interval:  401 QTC Calculation: 430 R Axis:   -35  Text Interpretation: Sinus rhythm Left axis deviation Confirmed by Alvino Blood ([id number]) on 03/19/2023 10:04:20 PM         Imaging Studies ordered: I ordered imaging studies including CT angio dissection protocol On my interpretation imaging demonstrates no dissection, no central PE     Medicines ordered and prescription drug management: Meds ordered this encounter  Medications   iohexol (OMNIPAQUE) 350 MG/ML injection 100 mL    -I have reviewed the patients home medicines and have made adjustments as needed   Cardiac Monitoring: The  patient was maintained on a cardiac monitor.  I personally viewed and interpreted the cardiac monitored which showed an underlying rhythm of: NSR  Reevaluation: After the interventions noted above, I reevaluated the patient and found that their symptoms have improved  Co morbidities that complicate the patient evaluation  Past Medical History:  Diagnosis Date   Hyperlipidemia       Dispostion: Disposition decision including need for hospitalization was considered, and patient disposition pending at time of sign out.    Final Clinical Impression(s) / ED  Diagnoses Final diagnoses:  Chest pain, unspecified type  Acute pericarditis, unspecified type     This chart was dictated using voice recognition software.  Despite best efforts to proofread,  errors can occur which can change the documentation meaning.    Lonell Grandchild, MD 03/19/23 [phone]

## 2023-03-20 LAB — TROPONIN I (HIGH SENSITIVITY): Troponin I (High Sensitivity): 5 ng/L (ref <18)

## 2023-03-20 NOTE — ED Provider Notes (Signed)
I assumed care at signout to follow-up on imaging.  No acute changes or findings on CT imaging.  Patient reports feeling improved.  He reports he is very active at baseline has had no chest pain or shortness of breath or fatigue with exertion.  He does report increased upper body work including bench pressing and push-ups.  This could have triggered his pain.  However pericarditis is still a possibility but there is no effusion.  He is safe for discharge home.  He does report he was treated for Lyme disease several months ago but completed a treatment of doxycycline without any issues.  No other recent illnesses. EKG is unremarkable, no signs of AV block.  Patient is safe for discharge home.   Zadie Rhine, MD 03/20/23 0111

## 2023-04-14 DIAGNOSIS — Z125 Encounter for screening for malignant neoplasm of prostate: Secondary | ICD-10-CM | POA: Diagnosis not present

## 2023-04-14 DIAGNOSIS — Z79899 Other long term (current) drug therapy: Secondary | ICD-10-CM | POA: Diagnosis not present

## 2023-04-30 ENCOUNTER — Telehealth: Payer: Self-pay | Admitting: Cardiology

## 2023-04-30 DIAGNOSIS — E785 Hyperlipidemia, unspecified: Secondary | ICD-10-CM

## 2023-04-30 MED ORDER — ROSUVASTATIN CALCIUM 20 MG PO TABS: 20.0000 mg | ORAL_TABLET | Freq: Every day | ORAL | 2 refills | Status: DC

## 2023-04-30 NOTE — Telephone Encounter (Signed)
Pt's medication was sent to pt's pharmacy as requested. Confirmation received.  °

## 2023-04-30 NOTE — Telephone Encounter (Signed)
*  STAT* If patient is at the pharmacy, call can be transferred to refill team.   1. Which medications need to be refilled? (please list name of each medication and dose if known) rosuvastatin (CRESTOR) 20 MG tablet  2. Which pharmacy/location (including street and city if local pharmacy) is medication to be sent to? [facility] - [address], [place], [place] [address]  3. Do they need a 30 day or 90 day supply?  90 day supply

## 2023-06-16 NOTE — Progress Notes (Signed)
HPI: FU CP. Calcium score October 2023 0.  Echocardiogram December 2023 showed ejection fraction 55 to 60%, grade 1 diastolic dysfunction, moderate biatrial enlargement, mild aortic insufficiency, moderately dilated aortic root at 48 mm.  Coronary CTA January 2024 showed calcium score 0 and no coronary disease.  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  Since last seen he denies dyspnea, chest pain, palpitations or syncope.  Current Outpatient Medications  Medication Sig Dispense Refill   ADVIL 200 MG CAPS Take 200-400 mg by mouth every 8 (eight) hours as needed (for pain or discomfort).     EXCEDRIN EXTRA STRENGTH 250-250-65 MG tablet Take 1 tablet by mouth every 6 (six) hours as needed for headache (or pain).     ezetimibe (ZETIA) 10 MG tablet Take 1 tablet (10 mg total) by mouth daily. 90 tablet 3   Misc Natural Products (SUPER GREENS PO) Take 4-8 oz by mouth See admin instructions. Athletic Greens drink/shake/smoothie- Drink 4-8 ounces by mouth in the morning     Multiple Vitamins-Minerals (MULTIVITAMIN WITH MINERALS) tablet Take 1 tablet by mouth at bedtime.     rosuvastatin (CRESTOR) 20 MG tablet Take 1 tablet (20 mg total) by mouth daily. 90 tablet 2   metoprolol tartrate (LOPRESSOR) 50 MG tablet TAKE 2 HOURS PRIOR TO CT SCAN (Patient not taking: Reported on 03/19/2023) 1 tablet 0   No current facility-administered medications for this visit.     Past Medical History:  Diagnosis Date   Hyperlipidemia     Past Surgical History:  Procedure Laterality Date   HERNIA REPAIR     as an infant   INGUINAL HERNIA REPAIR Left 10/24/2020   Procedure: OPEN LEFT INGUINAL HERNIA REPAIR;  Surgeon: Berna Bue, MD;  Location: [facility];  Service: General;  Laterality: Left;  90 MIN   TENDON REPAIR Bilateral    peroneal tendon   WISDOM TOOTH EXTRACTION      Social History   Socioeconomic History   Marital status: Married    Spouse name:  Not on file   Number of children: 2   Years of education: Not on file   Highest education level: Not on file  Occupational History   Not on file  Tobacco Use   Smoking status: Never   Smokeless tobacco: Never  Vaping Use   Vaping status: Never Used  Substance and Sexual Activity   Alcohol use: Yes    Alcohol/week: 5.0 standard drinks of alcohol    Types: 5 Standard drinks or equivalent per week    Comment: 1-2 beers per night   Drug use: Never   Sexual activity: Not on file  Other Topics Concern   Not on file  Social History Narrative   Not on file   Social Drivers of Health   Financial Resource Strain: Not on file  Food Insecurity: Not on file  Transportation Needs: Not on file  Physical Activity: Not on file  Stress: Not on file  Social Connections: Not on file  Intimate Partner Violence: Not on file    Family History  Problem Relation Age of Onset   Coronary artery disease Father     ROS: no fevers or chills, productive cough, hemoptysis, dysphasia, odynophagia, melena, hematochezia, dysuria, hematuria, rash, seizure activity, orthopnea, PND, pedal edema, claudication. Remaining systems are negative.  Physical Exam: Well-developed well-nourished in no acute distress.  Skin is warm and dry.  HEENT is normal.  Neck is supple.  Chest is clear to auscultation with normal expansion.  Cardiovascular exam is regular rate and rhythm.  Abdominal exam nontender or distended. No masses palpated. Extremities show no edema. neuro grossly intact  A/P  1 chest pain-previous CTA showed no coronary disease and LV function normal on echocardiogram.  No plans for further evaluation.  2 thoracic aortic aneurysm-patient will need follow-up CTA September 2025.  3 hyperlipidemia-continue zetia and statin. Check lipids and liver.  4 abnormal abdominal CTA-CTA September 2024 states "numerous borderline and a few slightly prominent retroperitoneal chain lymph nodes could be  reactive or neoplastic".  I have asked him to follow-up with primary care for this issue.  Unclear to me if further imaging is needed.  Olga Millers, MD

## 2023-06-22 ENCOUNTER — Ambulatory Visit: Payer: 59 | Attending: Cardiology | Admitting: Cardiology

## 2023-06-22 ENCOUNTER — Encounter: Payer: Self-pay | Admitting: Cardiology

## 2023-06-22 VITALS — BP 120/62 | HR 63 | Ht 76.0 in | Wt 222.0 lb

## 2023-06-22 DIAGNOSIS — R072 Precordial pain: Secondary | ICD-10-CM | POA: Diagnosis not present

## 2023-06-22 DIAGNOSIS — I77810 Thoracic aortic ectasia: Secondary | ICD-10-CM

## 2023-06-22 DIAGNOSIS — E785 Hyperlipidemia, unspecified: Secondary | ICD-10-CM

## 2023-06-22 NOTE — Patient Instructions (Signed)
  Lab Work:  Your physician recommends that you return for lab work FASTING  If you have labs (blood work) drawn today and your tests are completely normal, you will receive your results only by: [place] (if you have [place]) OR A paper copy in the mail If you have any lab test that is abnormal or we need to change your treatment, we will call you to review the results.   Follow-Up: At [facility], you and your health needs are our priority.  As part of our continuing mission to provide you with exceptional heart care, we have created designated Provider Care Teams.  These Care Teams include your primary Cardiologist (physician) and Advanced Practice Providers (APPs -  Physician Assistants and Nurse Practitioners) who all work together to provide you with the care you need, when you need it.  We recommend signing up for the patient portal called "MyChart".  Sign up information is provided on this After Visit Summary.  MyChart is used to connect with patients for Virtual Visits (Telemedicine).  Patients are able to view lab/test results, encounter notes, upcoming appointments, etc.  Non-urgent messages can be sent to your provider as well.   To learn more about what you can do with MyChart, go to [email or web address].    Your next appointment:   12 month(s)  Provider:   Kirk Ruths, MD

## 2023-07-17 DIAGNOSIS — M79674 Pain in right toe(s): Secondary | ICD-10-CM | POA: Diagnosis not present

## 2023-07-21 DIAGNOSIS — D2272 Melanocytic nevi of left lower limb, including hip: Secondary | ICD-10-CM | POA: Diagnosis not present

## 2023-08-05 DIAGNOSIS — L84 Corns and callosities: Secondary | ICD-10-CM | POA: Diagnosis not present

## 2023-08-05 DIAGNOSIS — M2041 Other hammer toe(s) (acquired), right foot: Secondary | ICD-10-CM | POA: Diagnosis not present

## 2023-08-05 DIAGNOSIS — M2042 Other hammer toe(s) (acquired), left foot: Secondary | ICD-10-CM | POA: Diagnosis not present

## 2023-08-05 DIAGNOSIS — M792 Neuralgia and neuritis, unspecified: Secondary | ICD-10-CM | POA: Diagnosis not present

## 2023-08-05 DIAGNOSIS — B351 Tinea unguium: Secondary | ICD-10-CM | POA: Diagnosis not present

## 2023-08-20 ENCOUNTER — Encounter: Payer: Self-pay | Admitting: *Deleted

## 2023-08-22 ENCOUNTER — Other Ambulatory Visit: Payer: Self-pay | Admitting: Cardiology

## 2023-08-22 DIAGNOSIS — E785 Hyperlipidemia, unspecified: Secondary | ICD-10-CM

## 2024-01-04 DIAGNOSIS — H2000 Unspecified acute and subacute iridocyclitis: Secondary | ICD-10-CM | POA: Diagnosis not present

## 2024-01-06 DIAGNOSIS — H2000 Unspecified acute and subacute iridocyclitis: Secondary | ICD-10-CM | POA: Diagnosis not present

## 2024-01-06 DIAGNOSIS — H5711 Ocular pain, right eye: Secondary | ICD-10-CM | POA: Diagnosis not present

## 2024-01-19 ENCOUNTER — Other Ambulatory Visit: Payer: Self-pay | Admitting: Cardiology

## 2024-01-19 DIAGNOSIS — E785 Hyperlipidemia, unspecified: Secondary | ICD-10-CM

## 2024-02-02 DIAGNOSIS — M2042 Other hammer toe(s) (acquired), left foot: Secondary | ICD-10-CM | POA: Diagnosis not present

## 2024-02-02 DIAGNOSIS — M2041 Other hammer toe(s) (acquired), right foot: Secondary | ICD-10-CM | POA: Diagnosis not present

## 2024-02-02 DIAGNOSIS — L84 Corns and callosities: Secondary | ICD-10-CM | POA: Diagnosis not present

## 2024-02-02 DIAGNOSIS — B351 Tinea unguium: Secondary | ICD-10-CM | POA: Diagnosis not present

## 2024-02-02 DIAGNOSIS — M792 Neuralgia and neuritis, unspecified: Secondary | ICD-10-CM | POA: Diagnosis not present

## 2024-02-23 ENCOUNTER — Other Ambulatory Visit: Payer: Self-pay | Admitting: *Deleted

## 2024-02-23 DIAGNOSIS — I77810 Thoracic aortic ectasia: Secondary | ICD-10-CM

## 2024-04-19 DIAGNOSIS — E785 Hyperlipidemia, unspecified: Secondary | ICD-10-CM | POA: Diagnosis not present

## 2024-04-19 DIAGNOSIS — Z1389 Encounter for screening for other disorder: Secondary | ICD-10-CM | POA: Diagnosis not present

## 2024-04-19 DIAGNOSIS — I77810 Thoracic aortic ectasia: Secondary | ICD-10-CM | POA: Diagnosis not present

## 2024-04-19 DIAGNOSIS — Z23 Encounter for immunization: Secondary | ICD-10-CM | POA: Diagnosis not present

## 2024-04-19 DIAGNOSIS — Z0000 Encounter for general adult medical examination without abnormal findings: Secondary | ICD-10-CM | POA: Diagnosis not present

## 2024-05-12 DIAGNOSIS — L84 Corns and callosities: Secondary | ICD-10-CM | POA: Diagnosis not present

## 2024-05-12 DIAGNOSIS — B351 Tinea unguium: Secondary | ICD-10-CM | POA: Diagnosis not present

## 2024-05-12 DIAGNOSIS — M2041 Other hammer toe(s) (acquired), right foot: Secondary | ICD-10-CM | POA: Diagnosis not present

## 2024-05-12 DIAGNOSIS — M2042 Other hammer toe(s) (acquired), left foot: Secondary | ICD-10-CM | POA: Diagnosis not present

## 2024-06-09 ENCOUNTER — Ambulatory Visit (HOSPITAL_COMMUNITY)
Admission: RE | Admit: 2024-06-09 | Discharge: 2024-06-09 | Disposition: A | Discharge status: Home / Self Care | Source: Ambulatory Visit | Attending: Cardiology | Admitting: Cardiology

## 2024-06-09 DIAGNOSIS — I77810 Thoracic aortic ectasia: Secondary | ICD-10-CM | POA: Diagnosis present

## 2024-06-09 DIAGNOSIS — N200 Calculus of kidney: Secondary | ICD-10-CM | POA: Diagnosis not present

## 2024-06-09 DIAGNOSIS — I77819 Aortic ectasia, unspecified site: Secondary | ICD-10-CM | POA: Diagnosis not present

## 2024-06-09 MED ORDER — IOHEXOL 350 MG/ML SOLN
75.0000 mL | Freq: Once | INTRAVENOUS | Status: AC | PRN
  Administered 2024-06-09: 75 mL via INTRAVENOUS

## 2024-06-14 DIAGNOSIS — M792 Neuralgia and neuritis, unspecified: Secondary | ICD-10-CM | POA: Diagnosis not present

## 2024-06-14 DIAGNOSIS — M2042 Other hammer toe(s) (acquired), left foot: Secondary | ICD-10-CM | POA: Diagnosis not present

## 2024-06-14 DIAGNOSIS — B351 Tinea unguium: Secondary | ICD-10-CM | POA: Diagnosis not present

## 2024-06-14 DIAGNOSIS — M2041 Other hammer toe(s) (acquired), right foot: Secondary | ICD-10-CM | POA: Diagnosis not present

## 2024-06-14 DIAGNOSIS — L84 Corns and callosities: Secondary | ICD-10-CM | POA: Diagnosis not present

## 2024-06-17 ENCOUNTER — Ambulatory Visit: Payer: Self-pay | Admitting: Cardiology

## 2024-06-17 DIAGNOSIS — I77810 Thoracic aortic ectasia: Secondary | ICD-10-CM

## 2024-07-23 ENCOUNTER — Other Ambulatory Visit: Payer: Self-pay | Admitting: Cardiology

## 2024-07-23 DIAGNOSIS — E785 Hyperlipidemia, unspecified: Secondary | ICD-10-CM

## 2024-07-26 ENCOUNTER — Encounter: Payer: Self-pay | Admitting: Cardiology

## 2024-07-26 DIAGNOSIS — E785 Hyperlipidemia, unspecified: Secondary | ICD-10-CM

## 2024-07-26 MED ORDER — ROSUVASTATIN CALCIUM 20 MG PO TABS: 20.0000 mg | ORAL_TABLET | Freq: Every day | ORAL | 0 refills | Status: AC

## 2024-07-27 NOTE — Progress Notes (Signed)
 "    HPI: FU CP. Calcium  score October 2023 0.  Echocardiogram December 2023 showed ejection fraction 55 to 60%, grade 1 diastolic dysfunction, moderate biatrial enlargement, mild aortic insufficiency, moderately dilated aortic root at 48 mm.  Coronary CTA January 2024 showed calcium  score 0 and no coronary disease.  CTA 12/25 showed 4.7 cm aortic root aneurysm unchanged compared to previous.  Since last seen the patient denies any dyspnea on exertion, orthopnea, PND, pedal edema, palpitations, syncope or chest pain.   Current Outpatient Medications  Medication Sig Dispense Refill   ADVIL 200 MG CAPS Take 200-400 mg by mouth every 8 (eight) hours as needed (for pain or discomfort).     EXCEDRIN EXTRA STRENGTH 250-250-65 MG tablet Take 1 tablet by mouth every 6 (six) hours as needed for headache (or pain).     ezetimibe  (ZETIA ) 10 MG tablet TAKE 1 TABLET BY MOUTH EVERY DAY 30 tablet 11   Misc Natural Products (SUPER GREENS PO) Take 4-8 oz by mouth See admin instructions. Athletic Greens drink/shake/smoothie- Drink 4-8 ounces by mouth in the morning     Multiple Vitamins-Minerals (MULTIVITAMIN WITH MINERALS) tablet Take 1 tablet by mouth at bedtime.     rosuvastatin  (CRESTOR ) 20 MG tablet Take 1 tablet (20 mg total) by mouth daily. 30 tablet 0   No current facility-administered medications for this visit.     Past Medical History:  Diagnosis Date   Hyperlipidemia     Past Surgical History:  Procedure Laterality Date   HERNIA REPAIR     as an infant   INGUINAL HERNIA REPAIR Left 10/24/2020   Procedure: OPEN LEFT INGUINAL HERNIA REPAIR;  Surgeon: Signe Mitzie LABOR, MD;  Location: [facility];  Service: General;  Laterality: Left;  90 MIN   TENDON REPAIR Bilateral    peroneal tendon   WISDOM TOOTH EXTRACTION      Social History   Socioeconomic History   Marital status: Married    Spouse name: Not on file   Number of children: 2   Years of education: Not on file   Highest education  level: Bachelor's degree (e.g., BA, AB, BS)  Occupational History   Not on file  Tobacco Use   Smoking status: Never   Smokeless tobacco: Never  Vaping Use   Vaping status: Never Used  Substance and Sexual Activity   Alcohol use: Yes    Alcohol/week: 5.0 standard drinks of alcohol    Types: 5 Standard drinks or equivalent per week    Comment: 1-2 beers per night   Drug use: Never   Sexual activity: Not on file  Other Topics Concern   Not on file  Social History Narrative   Not on file   Social Drivers of Health   Tobacco Use: Low Risk (08/01/2024)   Patient History    Smoking Tobacco Use: Never    Smokeless Tobacco Use: Never    Passive Exposure: Not on file  Financial Resource Strain: Low Risk (07/28/2024)   Overall Financial Resource Strain (CARDIA)    Difficulty of Paying Living Expenses: Not hard at all  Food Insecurity: No Food Insecurity (07/28/2024)   Epic    Worried About Radiation Protection Practitioner of Food in the Last Year: Never true    Ran Out of Food in the Last Year: Never true  Transportation Needs: No Transportation Needs (07/28/2024)   Epic    Lack of Transportation (Medical): No    Lack of Transportation (Non-Medical): No  Physical Activity: Sufficiently Active (  07/28/2024)   Exercise Vital Sign    Days of Exercise per Week: 7 days    Minutes of Exercise per Session: 90 min  Stress: No Stress Concern Present (07/28/2024)   [employer] of Occupational Health - Occupational Stress Questionnaire    Feeling of Stress: Only a little  Social Connections: Socially Integrated (07/28/2024)   Social Connection and Isolation Panel    Frequency of Communication with Friends and Family: More than three times a week    Frequency of Social Gatherings with Friends and Family: More than three times a week    Attends Religious Services: 1 to 4 times per year    Active Member of [employer] or Organizations: Yes    Attends Engineer, Structural: More than 4 times per year    Marital  Status: Married  Catering Manager Violence: Not on file  Depression (EYV7-0): Not on file  Alcohol Screen: Low Risk (07/28/2024)   Alcohol Screen    Last Alcohol Screening Score (AUDIT): 2  Housing: Low Risk (07/28/2024)   Epic    Unable to Pay for Housing in the Last Year: No    Number of Times Moved in the Last Year: 0    Homeless in the Last Year: No  Utilities: Not on file  Health Literacy: Not on file    Family History  Problem Relation Age of Onset   Coronary artery disease Father     ROS: no fevers or chills, productive cough, hemoptysis, dysphasia, odynophagia, melena, hematochezia, dysuria, hematuria, rash, seizure activity, orthopnea, PND, pedal edema, claudication. Remaining systems are negative.  Physical Exam: Well-developed well-nourished in no acute distress.  Skin is warm and dry.  HEENT is normal.  Neck is supple.  Chest is clear to auscultation with normal expansion.  Cardiovascular exam is regular rate and rhythm.  Abdominal exam nontender or distended. No masses palpated. Extremities show no edema. neuro grossly intact   A/P  1 thoracic aortic aneurysm-plan follow-up CTA December 2026.  Also with history of mild aortic insufficiency.  Will repeat echocardiogram.  2 history of chest pain-previous CTA showed no coronary disease.  LV function normal on echocardiogram.  No plans for further evaluation.  3 hyperlipidemia-recent liver functions elevated.  Discontinue Crestor  and continue Zetia .  Will consider PCSK9 inhibitor if indicated.  Redell Shallow, MD    "

## 2024-07-29 ENCOUNTER — Telehealth: Payer: Self-pay

## 2024-07-29 DIAGNOSIS — E785 Hyperlipidemia, unspecified: Secondary | ICD-10-CM

## 2024-07-29 DIAGNOSIS — R072 Precordial pain: Secondary | ICD-10-CM

## 2024-07-29 LAB — LIPID PANEL
Chol/HDL Ratio: 2.2 ratio (ref 0.0–5.0)
Cholesterol, Total: 158 mg/dL (ref 100–199)
HDL: 72 mg/dL
LDL Chol Calc (NIH): 74 mg/dL (ref 0–99)
Triglycerides: 57 mg/dL (ref 0–149)
VLDL Cholesterol Cal: 12 mg/dL (ref 5–40)

## 2024-07-29 LAB — HEPATIC FUNCTION PANEL
ALT: 73 [IU]/L — ABNORMAL HIGH (ref 0–44)
AST: 71 [IU]/L — ABNORMAL HIGH (ref 0–40)
Albumin: 4.5 g/dL (ref 3.8–4.9)
Alkaline Phosphatase: 71 [IU]/L (ref 47–123)
Bilirubin Total: 0.6 mg/dL (ref 0.0–1.2)
Bilirubin, Direct: 0.22 mg/dL (ref 0.00–0.40)
Total Protein: 6.9 g/dL (ref 6.0–8.5)

## 2024-07-29 NOTE — Telephone Encounter (Signed)
 Lab reordered because orders from 2024

## 2024-07-31 ENCOUNTER — Ambulatory Visit: Payer: Self-pay | Admitting: Cardiology

## 2024-08-01 ENCOUNTER — Ambulatory Visit: Payer: Self-pay | Admitting: Cardiology

## 2024-08-01 ENCOUNTER — Encounter: Payer: Self-pay | Admitting: Cardiology

## 2024-08-01 VITALS — BP 114/82 | HR 55 | Ht 76.0 in | Wt 220.8 lb

## 2024-08-01 DIAGNOSIS — E785 Hyperlipidemia, unspecified: Secondary | ICD-10-CM

## 2024-08-01 DIAGNOSIS — R072 Precordial pain: Secondary | ICD-10-CM | POA: Diagnosis not present

## 2024-08-01 DIAGNOSIS — I77810 Thoracic aortic ectasia: Secondary | ICD-10-CM

## 2024-08-01 DIAGNOSIS — I359 Nonrheumatic aortic valve disorder, unspecified: Secondary | ICD-10-CM

## 2024-08-01 NOTE — Patient Instructions (Signed)
" ° °  Lab Work:  Your physician recommends that you return for lab work in: 8 [facility]  If you have labs (blood work) drawn today and your tests are completely normal, you will receive your results only by: [employer] (if you have MyChart) OR A paper copy in the mail If you have any lab test that is abnormal or we need to change your treatment, we will call you to review the results.  Testing/Procedures:  Your physician has requested that you have an echocardiogram. Echocardiography is a painless test that uses sound waves to create images of your heart. It provides your doctor with information about the size and shape of your heart and how well your hearts chambers and valves are working. This procedure takes approximately one hour. There are no restrictions for this procedure. Please do NOT wear cologne, perfume, aftershave, or lotions (deodorant is allowed). Please arrive 15 minutes prior to your appointment time.  Please note: We ask at that you not bring children with you during ultrasound (echo/ vascular) testing. Due to room size and safety concerns, children are not allowed in the ultrasound rooms during exams. Our front office staff cannot provide observation of children in our lobby area while testing is being conducted. An adult accompanying a patient to their appointment will only be allowed in the ultrasound room at the discretion of the ultrasound technician under special circumstances. We apologize for any inconvenience. [address]  Follow-Up: At [facility], you and your health needs are our priority.  As part of our continuing mission to provide you with exceptional heart care, our providers are all part of one team.  This team includes your primary Cardiologist (physician) and Advanced Practice Providers or APPs (Physician Assistants and Nurse Practitioners) who all work together to provide you with the care you need, when you need it.  Your next  appointment:   12 month(s)  Provider:   Redell Shallow, MD            "

## 2024-09-07 ENCOUNTER — Ambulatory Visit (HOSPITAL_COMMUNITY)
